# Patient Record
Sex: Female | Born: 1980 | Race: White | Hispanic: No | Smoking: Never smoker
Health system: Southern US, Community
[De-identification: ages and names within clinical notes are randomized; demographics above are authoritative.]

## PROBLEM LIST (undated history)

## (undated) DIAGNOSIS — F5105 Insomnia due to other mental disorder: Secondary | ICD-10-CM

## (undated) DIAGNOSIS — R42 Dizziness and giddiness: Secondary | ICD-10-CM

## (undated) DIAGNOSIS — Z789 Other specified health status: Secondary | ICD-10-CM

## (undated) DIAGNOSIS — F419 Anxiety disorder, unspecified: Secondary | ICD-10-CM

## (undated) DIAGNOSIS — R87612 Low grade squamous intraepithelial lesion on cytologic smear of cervix (LGSIL): Secondary | ICD-10-CM

## (undated) DIAGNOSIS — R5383 Other fatigue: Secondary | ICD-10-CM

## (undated) HISTORY — DX: Other specified health status: Z78.9

## (undated) HISTORY — PX: EYE SURGERY: SHX253

## (undated) HISTORY — PX: CHOLECYSTECTOMY: SHX55

---

## 2015-10-17 DIAGNOSIS — R87612 Low grade squamous intraepithelial lesion on cytologic smear of cervix (LGSIL): Secondary | ICD-10-CM | POA: Insufficient documentation

## 2016-07-12 DIAGNOSIS — B977 Papillomavirus as the cause of diseases classified elsewhere: Secondary | ICD-10-CM

## 2016-07-12 HISTORY — DX: Papillomavirus as the cause of diseases classified elsewhere: B97.7

## 2018-02-03 ENCOUNTER — Encounter: Payer: Self-pay | Admitting: Certified Nurse Midwife

## 2018-02-03 ENCOUNTER — Ambulatory Visit (INDEPENDENT_AMBULATORY_CARE_PROVIDER_SITE_OTHER): Payer: BLUE CROSS/BLUE SHIELD | Admitting: Certified Nurse Midwife

## 2018-02-03 VITALS — BP 117/83 | HR 94 | Ht 66.0 in | Wt 181.1 lb

## 2018-02-03 DIAGNOSIS — R4586 Emotional lability: Secondary | ICD-10-CM | POA: Diagnosis not present

## 2018-02-03 DIAGNOSIS — R61 Generalized hyperhidrosis: Secondary | ICD-10-CM

## 2018-02-03 DIAGNOSIS — G4700 Insomnia, unspecified: Secondary | ICD-10-CM

## 2018-02-03 DIAGNOSIS — R0683 Snoring: Secondary | ICD-10-CM

## 2018-02-03 DIAGNOSIS — F4321 Adjustment disorder with depressed mood: Secondary | ICD-10-CM

## 2018-02-03 DIAGNOSIS — R5383 Other fatigue: Secondary | ICD-10-CM

## 2018-02-03 DIAGNOSIS — Z3041 Encounter for surveillance of contraceptive pills: Secondary | ICD-10-CM

## 2018-02-03 DIAGNOSIS — R635 Abnormal weight gain: Secondary | ICD-10-CM

## 2018-02-03 NOTE — Patient Instructions (Signed)
Major Depressive Disorder, Adult Major depressive disorder (MDD) is a mental health condition. MDD often makes you feel sad, hopeless, or helpless. MDD can also cause symptoms in your body. MDD can affect your:  Work.  School.  Relationships.  Other normal activities.  MDD can range from mild to very bad. It may occur once (single episode MDD). It can also occur many times (recurrent MDD). The main symptoms of MDD often include:  Feeling sad, depressed, or irritable most of the time.  Loss of interest.  MDD symptoms also include:  Sleeping too much or too little.  Eating too much or too little.  A change in your weight.  Feeling tired (fatigue) or having low energy.  Feeling worthless.  Feeling guilty.  Trouble making decisions.  Trouble thinking clearly.  Thoughts of suicide or harming others.  Feeling weak.  Feeling agitated.  Keeping yourself from being around other people (isolation).  Follow these instructions at home: Activity  Do these things as told by your doctor: ? Go back to your normal activities. ? Exercise regularly. ? Spend time outdoors. Alcohol  Talk with your doctor about how alcohol can affect your antidepressant medicines.  Do not drink alcohol. Or, limit how much alcohol you drink. ? This means no more than 1 drink a day for nonpregnant women and 2 drinks a day for men. One drink equals one of these:  12 oz of beer.  5 oz of wine.  1 oz of hard liquor. General instructions  Take over-the-counter and prescription medicines only as told by your doctor.  Eat a healthy diet.  Get plenty of sleep.  Find activities that you enjoy. Make time to do them.  Think about joining a support group. Your doctor may be able to suggest a group for you.  Keep all follow-up visits as told by your doctor. This is important. Where to find more information:  [employer] on Mental Illness: ? www.nami.org  [place] [employer] of  Mental Health: ? http://www.maynard.net/  National Suicide Prevention Lifeline: ? 930-863-0562. This is free, 24-hour help. Contact a doctor if:  Your symptoms get worse.  You have new symptoms. Get help right away if:  You self-harm.  You see, hear, taste, smell, or feel things that are not present (hallucinate). If you ever feel like you may hurt yourself or others, or have thoughts about taking your own life, get help right away. You can go to your nearest emergency department or call:  Your local emergency services (911 in the [place]  A suicide crisis helpline, such as the National Suicide Prevention Lifeline: ? (901)852-0710. This is open 24 hours a day.  This information is not intended to replace advice given to you by your health care provider. Make sure you discuss any questions you have with your health care provider. Document Released: 06/09/2015 Document Revised: 03/14/2016 Document Reviewed: 03/14/2016 Elsevier Interactive Patient Education  2017 Elsevier Inc.  Perimenopause Perimenopause is the time when your body begins to move into the menopause (no menstrual period for 12 straight months). It is a natural process. Perimenopause can begin 2-8 years before the menopause and usually lasts for 1 year after the menopause. During this time, your ovaries may or may not produce an egg. The ovaries vary in their production of estrogen and progesterone hormones each month. This can cause irregular menstrual periods, difficulty getting pregnant, vaginal bleeding between periods, and uncomfortable symptoms. What are the causes?  Irregular production of the ovarian hormones, estrogen  and progesterone, and not ovulating every month. Other causes include:  Tumor of the pituitary gland in the brain.  Medical disease that affects the ovaries.  Radiation treatment.  Chemotherapy.  Unknown causes.  Heavy smoking and excessive alcohol intake can bring on perimenopause  sooner.  What are the signs or symptoms?  Hot flashes.  Night sweats.  Irregular menstrual periods.  Decreased sex drive.  Vaginal dryness.  Headaches.  Mood swings.  Depression.  Memory problems.  Irritability.  Tiredness.  Weight gain.  Trouble getting pregnant.  The beginning of losing bone cells (osteoporosis).  The beginning of hardening of the arteries (atherosclerosis). How is this diagnosed? Your health care provider will make a diagnosis by analyzing your age, menstrual history, and symptoms. He or she will do a physical exam and note any changes in your body, especially your female organs. Female hormone tests may or may not be helpful depending on the amount of female hormones you produce and when you produce them. However, other hormone tests may be helpful to rule out other problems. How is this treated? In some cases, no treatment is needed. The decision on whether treatment is necessary during the perimenopause should be made by you and your health care provider based on how the symptoms are affecting you and your lifestyle. Various treatments are available, such as:  Treating individual symptoms with a specific medicine for that symptom.  Herbal medicines that can help specific symptoms.  Counseling.  Group therapy.  Follow these instructions at home:  Keep track of your menstrual periods (when they occur, how heavy they are, how long between periods, and how long they last) as well as your symptoms and when they started.  Only take over-the-counter or prescription medicines as directed by your health care provider.  Sleep and rest.  Exercise.  Eat a diet that contains calcium (good for your bones) and soy (acts like the estrogen hormone).  Do not smoke.  Avoid alcoholic beverages.  Take vitamin supplements as recommended by your health care provider. Taking vitamin E may help in certain cases.  Take calcium and vitamin D supplements to  help prevent bone loss.  Group therapy is sometimes helpful.  Acupuncture may help in some cases. Contact a health care provider if:  You have questions about any symptoms you are having.  You need a referral to a specialist (gynecologist, psychiatrist, or psychologist). Get help right away if:  You have vaginal bleeding.  Your period lasts longer than 8 days.  Your periods are recurring sooner than 21 days.  You have bleeding after intercourse.  You have severe depression.  You have pain when you urinate.  You have severe headaches.  You have vision problems. This information is not intended to replace advice given to you by your health care provider. Make sure you discuss any questions you have with your health care provider. Document Released: 08/05/2004 Document Revised: 12/04/2015 Document Reviewed: 01/25/2013 Elsevier Interactive Patient Education  2017 Elsevier Inc. Sleep Apnea Sleep apnea is a condition that affects breathing. People with sleep apnea have moments during sleep when their breathing pauses briefly or gets shallow. Sleep apnea can cause these symptoms:  Trouble staying asleep.  Sleepiness or tiredness during the day.  Irritability.  Loud snoring.  Morning headaches.  Trouble concentrating.  Forgetting things.  Less interest in sex.  Being sleepy for no reason.  Mood swings.  Personality changes.  Depression.  Waking up a lot during the night to pee (urinate).  Dry mouth.  Sore throat.  Follow these instructions at home:  Make any changes in your routine that your doctor recommends.  Eat a healthy, well-balanced diet.  Take over-the-counter and prescription medicines only as told by your doctor.  Avoid using alcohol, calming medicines (sedatives), and narcotic medicines.  Take steps to lose weight if you are overweight.  If you were given a machine (device) to use while you sleep, use it only as told by your doctor.  Do  not use any tobacco products, such as cigarettes, chewing tobacco, and e-cigarettes. If you need help quitting, ask your doctor.  Keep all follow-up visits as told by your doctor. This is important. Contact a doctor if:  The machine that you were given to use during sleep is uncomfortable or does not seem to be working.  Your symptoms do not get better.  Your symptoms get worse. Get help right away if:  Your chest hurts.  You have trouble breathing in enough air (shortness of breath).  You have an uncomfortable feeling in your back, arms, or stomach.  You have trouble talking.  One side of your body feels weak.  A part of your face is hanging down (drooping). These symptoms may be an emergency. Do not wait to see if the symptoms will go away. Get medical help right away. Call your local emergency services (911 in the [place] Do not drive yourself to the hospital. This information is not intended to replace advice given to you by your health care provider. Make sure you discuss any questions you have with your health care provider. Document Released: 04/06/2008 Document Revised: 02/22/2016 Document Reviewed: 04/07/2015 Elsevier Interactive Patient Education  2018 [employer] Insomnia Insomnia is a sleep disorder that makes it difficult to fall asleep or to stay asleep. Insomnia can cause tiredness (fatigue), low energy, difficulty concentrating, mood swings, and poor performance at work or school. There are three different ways to classify insomnia:  Difficulty falling asleep.  Difficulty staying asleep.  Waking up too early in the morning.  Any type of insomnia can be long-term (chronic) or short-term (acute). Both are common. Short-term insomnia usually lasts for three months or less. Chronic insomnia occurs at least three times a week for longer than three months. What are the causes? Insomnia may be caused by another condition, situation, or substance, such  as:  Anxiety.  Certain medicines.  Gastroesophageal reflux disease (GERD) or other gastrointestinal conditions.  Asthma or other breathing conditions.  Restless legs syndrome, sleep apnea, or other sleep disorders.  Chronic pain.  Menopause. This may include hot flashes.  Stroke.  Abuse of alcohol, tobacco, or illegal drugs.  Depression.  Caffeine.  Neurological disorders, such as Alzheimer disease.  An overactive thyroid (hyperthyroidism).  The cause of insomnia may not be known. What increases the risk? Risk factors for insomnia include:  Gender. Women are more commonly affected than men.  Age. Insomnia is more common as you get older.  Stress. This may involve your professional or personal life.  Income. Insomnia is more common in people with lower income.  Lack of exercise.  Irregular work schedule or night shifts.  Traveling between different time zones.  What are the signs or symptoms? If you have insomnia, trouble falling asleep or trouble staying asleep is the main symptom. This may lead to other symptoms, such as:  Feeling fatigued.  Feeling nervous about going to sleep.  Not feeling rested in the morning.  Having trouble concentrating.  Feeling  irritable, anxious, or depressed.  How is this treated? Treatment for insomnia depends on the cause. If your insomnia is caused by an underlying condition, treatment will focus on addressing the condition. Treatment may also include:  Medicines to help you sleep.  Counseling or therapy.  Lifestyle adjustments.  Follow these instructions at home:  Take medicines only as directed by your health care provider.  Keep regular sleeping and waking hours. Avoid naps.  Keep a sleep diary to help you and your health care provider figure out what could be causing your insomnia. Include: ? When you sleep. ? When you wake up during the night. ? How well you sleep. ? How rested you feel the next  day. ? Any side effects of medicines you are taking. ? What you eat and drink.  Make your bedroom a comfortable place where it is easy to fall asleep: ? Put up shades or special blackout curtains to block light from outside. ? Use a white noise machine to block noise. ? Keep the temperature cool.  Exercise regularly as directed by your health care provider. Avoid exercising right before bedtime.  Use relaxation techniques to manage stress. Ask your health care provider to suggest some techniques that may work well for you. These may include: ? Breathing exercises. ? Routines to release muscle tension. ? Visualizing peaceful scenes.  Cut back on alcohol, caffeinated beverages, and cigarettes, especially close to bedtime. These can disrupt your sleep.  Do not overeat or eat spicy foods right before bedtime. This can lead to digestive discomfort that can make it hard for you to sleep.  Limit screen use before bedtime. This includes: ? Watching TV. ? Using your smartphone, tablet, and computer.  Stick to a routine. This can help you fall asleep faster. Try to do a quiet activity, brush your teeth, and go to bed at the same time each night.  Get out of bed if you are still awake after 15 minutes of trying to sleep. Keep the lights down, but try reading or doing a quiet activity. When you feel sleepy, go back to bed.  Make sure that you drive carefully. Avoid driving if you feel very sleepy.  Keep all follow-up appointments as directed by your health care provider. This is important. Contact a health care provider if:  You are tired throughout the day or have trouble in your daily routine due to sleepiness.  You continue to have sleep problems or your sleep problems get worse. Get help right away if:  You have serious thoughts about hurting yourself or someone else. This information is not intended to replace advice given to you by your health care provider. Make sure you discuss any  questions you have with your health care provider. Document Released: 06/25/2000 Document Revised: 11/28/2015 Document Reviewed: 03/29/2014 Elsevier Interactive Patient Education  [employer].

## 2018-02-03 NOTE — Progress Notes (Signed)
New pt is here with c/o increased night sweats. Pt states she has no motivation. Screening 13.

## 2018-02-10 ENCOUNTER — Telehealth: Payer: Self-pay | Admitting: Certified Nurse Midwife

## 2018-02-10 DIAGNOSIS — Z3041 Encounter for surveillance of contraceptive pills: Secondary | ICD-10-CM | POA: Insufficient documentation

## 2018-02-10 LAB — HEMOGLOBIN A1C
Est. average glucose Bld gHb Est-mCnc: 105 mg/dL
Hgb A1c MFr Bld: 5.3 % (ref 4.8–5.6)

## 2018-02-10 LAB — THYROID PANEL WITH TSH
Free Thyroxine Index: 2.3 (ref 1.2–4.9)
T3 Uptake Ratio: 21 % — ABNORMAL LOW (ref 24–39)
T4, Total: 10.9 ug/dL (ref 4.5–12.0)
TSH: 0.715 u[IU]/mL (ref 0.450–4.500)

## 2018-02-10 LAB — PROGESTERONE: Progesterone: 0.2 ng/mL

## 2018-02-10 LAB — FSH/LH
FSH: 3.5 m[IU]/mL
LH: 2.7 m[IU]/mL

## 2018-02-10 LAB — ESTRADIOL: Estradiol: 12.5 pg/mL

## 2018-02-10 LAB — VITAMIN D 1,25 DIHYDROXY
VITAMIN D3 1, 25 (OH): 49 pg/mL
Vitamin D 1, 25 (OH)2 Total: 52 pg/mL
Vitamin D2 1, 25 (OH)2: <10 pg/mL

## 2018-02-10 LAB — LIPID PANEL
CHOLESTEROL TOTAL: 160 mg/dL (ref 100–199)
Chol/HDL Ratio: 3 ratio (ref 0.0–4.4)
HDL: 54 mg/dL (ref >39)
LDL Calculated: 76 mg/dL (ref 0–99)
Triglycerides: 148 mg/dL (ref 0–149)
VLDL Cholesterol Cal: 30 mg/dL (ref 5–40)

## 2018-02-10 NOTE — Telephone Encounter (Signed)
Patient called requesting lab results. Thanks.

## 2018-02-10 NOTE — Progress Notes (Signed)
GYN ENCOUNTER NOTE  Subjective:       Heather Mcgee is a 37 y.o. G46P1001 female is here for gynecologic evaluation of the following issues:   1. Insomnia-not getting much sleeps, wakes up sporadically during the night time  2. Mood changes-feeling off  3. Fatigue  4. Weight gain and increased hunger  5. Night sweats-every night  6. Snoring-father has sleep apnea  7. Grief  Reports increased symptoms for the last few months. Dealing with recent death of grandmother and text message breakup on her birthday from her long distance boyfriend.   Denies difficulty breathing or respiratory distress, chest pain, abdominal pain, excessive vaginal bleeding, dysuria, and leg pain or swelling.    Gynecologic History  Patient's last menstrual period was 12/05/2017 (approximate). Period Pattern: Regular Menstrual Flow: Light  Contraception: OCP (estrogen/progesterone)  Last Pap: 04/2017. Results were: normal.   Obstetric History  OB History  Gravida Para Term Preterm AB Living  1 1 1     1   SAB TAB Ectopic Multiple Live Births          1    # Outcome Date GA Lbr Len/2nd Weight Sex Delivery Anes PTL Lv  1 Term 2011    M CS-Unspec  N LIV    Current Outpatient Medications on File Prior to Visit  Medication Sig Dispense Refill  . hydrOXYzine (VISTARIL) 50 MG capsule Take by mouth.    . Multiple Vitamin (MULTIVITAMIN) capsule Take by mouth.    . Omega-3 Fatty Acids (FISH OIL) 1000 MG CAPS Take by mouth.    . SRONYX 0.1-20 MG-MCG tablet Take 1 tablet by mouth daily.  3   No current facility-administered medications on file prior to visit.     Allergies no known allergies  Social History   Socioeconomic History  . Marital status: Single    Spouse name: Not on file  . Number of children: Not on file  . Years of education: Not on file  . Highest education level: Not on file  Occupational History  . Not on file  Social Needs  . Financial resource strain: Not on  file  . Food insecurity:    Worry: Not on file    Inability: Not on file  . Transportation needs:    Medical: Not on file    Non-medical: Not on file  Tobacco Use  . Smoking status: Never Smoker  . Smokeless tobacco: Never Used  Substance and Sexual Activity  . Alcohol use: Never    Frequency: Never  . Drug use: Never  . Sexual activity: Not Currently    Birth control/protection: Pill  Lifestyle  . Physical activity:    Days per week: Not on file    Minutes per session: Not on file  . Stress: Not on file  Relationships  . Social connections:    Talks on phone: Not on file    Gets together: Not on file    Attends religious service: Not on file    Active member of club or organization: Not on file    Attends meetings of clubs or organizations: Not on file    Relationship status: Not on file  . Intimate partner violence:    Fear of current or ex partner: Not on file    Emotionally abused: Not on file    Physically abused: Not on file    Forced sexual activity: Not on file  Other Topics Concern  . Not on file  Social History Narrative  .  Not on file    No family history on file.  The following portions of the patient's history were reviewed and updated as appropriate: allergies, current medications, past family history, past medical history, past social history, past surgical history and problem list.  Review of Systems  ROS negative except as noted above. Information obtained from patient.   Objective:   BP 117/83   Pulse 94   Ht 5\' 6"  (1.676 m)   Wt 181 lb 1 oz (82.1 kg)   LMP 12/05/2017 (Approximate)   BMI 29.22 kg/m   CONSTITUTIONAL: Well-developed, well-nourished female in no acute distress.   NECK: Normal range of motion, supple, no masses.  Normal thyroid. Circumference: 12.5 inches.  NEUROLGIC: Alert and oriented to person, place, and time.  PSYCHIATRIC: Normal mood and affect. Normal behavior. Normal judgment and thought content.    Office Visit  [facility] 02/03/2018 [facility]  PHQ-9 Total Score  13      Assessment:   1. Insomnia, unspecified type  - FSH/LH - Thyroid Panel With TSH - Estradiol - Vitamin D 1,25 dihydroxy - Progesterone - Lipid panel  2. Mood changes  - FSH/LH - Thyroid Panel With TSH - Estradiol - Vitamin D 1,25 dihydroxy - Progesterone - Lipid panel  3. Other fatigue  - FSH/LH - Thyroid Panel With TSH - Estradiol - Vitamin D 1,25 dihydroxy - Progesterone - Lipid panel - Hemoglobin A1c  4. Weight gain  - FSH/LH - Thyroid Panel With TSH - Estradiol - Vitamin D 1,25 dihydroxy - Progesterone - Lipid panel - Hemoglobin A1c  5. Night sweats  - FSH/LH - Thyroid Panel With TSH - Estradiol - Vitamin D 1,25 dihydroxy - Progesterone - Lipid panel  6. Snoring  - FSH/LH - Thyroid Panel With TSH - Estradiol - Vitamin D 1,25 dihydroxy - Progesterone - Lipid panel  7. Grief - FSH/LH - Thyroid Panel With TSH - Estradiol - Vitamin D 1,25 dihydroxy - Progesterone - Lipid panel     Plan:   Labs: see orders.   Discussed depression screening results.   Patient would like to await lab results and then discussed options for management and/or treatment.   Reviewed red flag symptoms and when to call.   RTC as needed.    Gunnar BullaJenkins Michelle Keren Alverio, CNM [facility], [facility]

## 2018-02-13 ENCOUNTER — Telehealth: Payer: Self-pay

## 2018-02-13 NOTE — Telephone Encounter (Signed)
Message left for pt- lab results are in but have not been reviewed yet. When reviewed pt will be contacted.

## 2018-02-14 NOTE — Telephone Encounter (Signed)
The patient called back and asked again about her results.  The patient was informed that Marcelino DusterMichelle will not be back in the office until 8/8 and she has to review the results before they can be shared with her.  She was also informed that a telephone message would be sent again to the nurse and provider because she is hoping those will be released soon as she is going out of town and "it has already been a week," please advise, thanks.

## 2018-02-15 NOTE — Telephone Encounter (Signed)
Please contact patient. Labs reviewed. Detailed MyChart message sent. Should schedule office visit to discuss results or treatment options further, if desired. Thanks, JML

## 2018-02-17 ENCOUNTER — Ambulatory Visit (INDEPENDENT_AMBULATORY_CARE_PROVIDER_SITE_OTHER): Payer: BLUE CROSS/BLUE SHIELD | Admitting: Certified Nurse Midwife

## 2018-02-17 ENCOUNTER — Encounter: Payer: Self-pay | Admitting: Certified Nurse Midwife

## 2018-02-17 VITALS — BP 114/82 | HR 83 | Ht 67.0 in | Wt 184.4 lb

## 2018-02-17 DIAGNOSIS — Z09 Encounter for follow-up examination after completed treatment for conditions other than malignant neoplasm: Secondary | ICD-10-CM | POA: Diagnosis not present

## 2018-02-17 MED ORDER — PAROXETINE HCL 10 MG PO TABS: 10.0000 mg | ORAL_TABLET | Freq: Every day | ORAL | 0 refills | Status: DC

## 2018-02-17 NOTE — Patient Instructions (Signed)
Paroxetine tablets What is this medicine? PAROXETINE (pa ROX e teen) is used to treat depression. It may also be used to treat anxiety disorders, obsessive compulsive disorder, panic attacks, post traumatic stress, and premenstrual dysphoric disorder (PMDD). This medicine may be used for other purposes; ask your health care provider or pharmacist if you have questions. COMMON BRAND NAME(S): Paxil, Pexeva What should I tell my health care provider before I take this medicine? They need to know if you have any of these conditions: -bipolar disorder or a family history of bipolar disorder -bleeding disorders -glaucoma -heart disease -kidney disease -liver disease -low levels of sodium in the blood -seizures -suicidal thoughts, plans, or attempt; a previous suicide attempt by you or a family member -take MAOIs like Carbex, Eldepryl, Marplan, Nardil, and Parnate -take medicines that treat or prevent blood clots -thyroid disease -an unusual or allergic reaction to paroxetine, other medicines, foods, dyes, or preservatives -pregnant or trying to get pregnant -breast-feeding How should I use this medicine? Take this medicine by mouth with a glass of water. Follow the directions on the prescription label. You can take it with or without food. Take your medicine at regular intervals. Do not take your medicine more often than directed. Do not stop taking this medicine suddenly except upon the advice of your doctor. Stopping this medicine too quickly may cause serious side effects or your condition may worsen. A special MedGuide will be given to you by the pharmacist with each prescription and refill. Be sure to read this information carefully each time. Talk to your pediatrician regarding the use of this medicine in children. Special care may be needed. Overdosage: If you think you have taken too much of this medicine contact a poison control center or emergency room at once. NOTE: This medicine is  only for you. Do not share this medicine with others. What if I miss a dose? If you miss a dose, take it as soon as you can. If it is almost time for your next dose, take only that dose. Do not take double or extra doses. What may interact with this medicine? Do not take this medicine with any of the following medications: -linezolid -MAOIs like Carbex, Eldepryl, Marplan, Nardil, and Parnate -methylene blue (injected into a vein) -pimozide -thioridazine This medicine may also interact with the following medications: -alcohol -amphetamines -aspirin and aspirin-like medicines -atomoxetine -certain medicines for depression, anxiety, or psychotic disturbances -certain medicines for irregular heart beat like propafenone, flecainide, encainide, and quinidine -certain medicines for migraine headache like almotriptan, eletriptan, frovatriptan, naratriptan, rizatriptan, sumatriptan, zolmitriptan -cimetidine -digoxin -diuretics -fentanyl -fosamprenavir -furazolidone -isoniazid -lithium -medicines that treat or prevent blood clots like warfarin, enoxaparin, and dalteparin -medicines for sleep -NSAIDs, medicines for pain and inflammation, like ibuprofen or naproxen -phenobarbital -phenytoin -procarbazine -rasagiline -ritonavir -supplements like St. John's wort, kava kava, valerian -tamoxifen -tramadol -tryptophan This list may not describe all possible interactions. Give your health care provider a list of all the medicines, herbs, non-prescription drugs, or dietary supplements you use. Also tell them if you smoke, drink alcohol, or use illegal drugs. Some items may interact with your medicine. What should I watch for while using this medicine? Tell your doctor if your symptoms do not get better or if they get worse. Visit your doctor or health care professional for regular checks on your progress. Because it may take several weeks to see the full effects of this medicine, it is important  to continue your treatment as prescribed by your doctor.  Patients and their families should watch out for new or worsening thoughts of suicide or depression. Also watch out for sudden changes in feelings such as feeling anxious, agitated, panicky, irritable, hostile, aggressive, impulsive, severely restless, overly excited and hyperactive, or not being able to sleep. If this happens, especially at the beginning of treatment or after a change in dose, call your health care professional. Bonita Quin may get drowsy or dizzy. Do not drive, use machinery, or do anything that needs mental alertness until you know how this medicine affects you. Do not stand or sit up quickly, especially if you are an older patient. This reduces the risk of dizzy or fainting spells. Alcohol may interfere with the effect of this medicine. Avoid alcoholic drinks. Your mouth may get dry. Chewing sugarless gum or sucking hard candy, and drinking plenty of water will help. Contact your doctor if the problem does not go away or is severe. What side effects may I notice from receiving this medicine? Side effects that you should report to your doctor or health care professional as soon as possible: -allergic reactions like skin rash, itching or hives, swelling of the face, lips, or tongue -anxious -black, tarry stools -changes in vision -confusion -elevated mood, decreased need for sleep, racing thoughts, impulsive behavior -eye pain -fast, irregular heartbeat -feeling faint or lightheaded, falls -feeling agitated, angry, or irritable -hallucination, loss of contact with reality -loss of balance or coordination -loss of memory -painful or prolonged erections -restlessness, pacing, inability to keep still -seizures -stiff muscles -suicidal thoughts or other mood changes -trouble sleeping -unusual bleeding or bruising -unusually weak or tired -vomiting Side effects that usually do not require medical attention (report to your  doctor or health care professional if they continue or are bothersome): -change in appetite or weight -change in sex drive or performance -diarrhea -dizziness -dry mouth -increased sweating -indigestion, nausea -tired -tremors This list may not describe all possible side effects. Call your doctor for medical advice about side effects. You may report side effects to FDA at 1-800-FDA-1088. Where should I keep my medicine? Keep out of the reach of children. Store at room temperature between 15 and 30 degrees C (59 and 86 degrees F). Keep container tightly closed. Throw away any unused medicine after the expiration date. NOTE: This sheet is a summary. It may not cover all possible information. If you have questions about this medicine, talk to your doctor, pharmacist, or health care provider.  2018 Elsevier/Gold Standard (2015-11-29 15:50:32) Phentermine tablets or capsules What is this medicine? PHENTERMINE (FEN ter meen) decreases your appetite. It is used with a reduced calorie diet and exercise to help you lose weight. This medicine may be used for other purposes; ask your health care provider or pharmacist if you have questions. COMMON BRAND NAME(S): Adipex-P, Atti-Plex P, Atti-Plex P Spansule, Fastin, Lomaira, Pro-Fast, Tara-8 What should I tell my health care provider before I take this medicine? They need to know if you have any of these conditions: -agitation -glaucoma -heart disease -high blood pressure -history of substance abuse -lung disease called Primary Pulmonary Hypertension (PPH) -taken an MAOI like Carbex, Eldepryl, Marplan, Nardil, or Parnate in last 14 days -thyroid disease -an unusual or allergic reaction to phentermine, other medicines, foods, dyes, or preservatives -pregnant or trying to get pregnant -breast-feeding How should I use this medicine? Take this medicine by mouth with a glass of water. Follow the directions on the prescription label. The  instructions for use may differ based on the product  and dose you are taking. Avoid taking this medicine in the evening. It may interfere with sleep. Take your doses at regular intervals. Do not take your medicine more often than directed. Talk to your pediatrician regarding the use of this medicine in children. While this drug may be prescribed for children 17 years or older for selected conditions, precautions do apply. Overdosage: If you think you have taken too much of this medicine contact a poison control center or emergency room at once. NOTE: This medicine is only for you. Do not share this medicine with others. What if I miss a dose? If you miss a dose, take it as soon as you can. If it is almost time for your next dose, take only that dose. Do not take double or extra doses. What may interact with this medicine? Do not take this medicine with any of the following medications: -duloxetine -MAOIs like Carbex, Eldepryl, Marplan, Nardil, and Parnate -medicines for colds or breathing difficulties like pseudoephedrine or phenylephrine -procarbazine -sibutramine -SSRIs like citalopram, escitalopram, fluoxetine, fluvoxamine, paroxetine, and sertraline -stimulants like dexmethylphenidate, methylphenidate or modafinil -venlafaxine This medicine may also interact with the following medications: -medicines for diabetes This list may not describe all possible interactions. Give your health care provider a list of all the medicines, herbs, non-prescription drugs, or dietary supplements you use. Also tell them if you smoke, drink alcohol, or use illegal drugs. Some items may interact with your medicine. What should I watch for while using this medicine? Notify your physician immediately if you become short of breath while doing your normal activities. Do not take this medicine within 6 hours of bedtime. It can keep you from getting to sleep. Avoid drinks that contain caffeine and try to stick to a  regular bedtime every night. This medicine was intended to be used in addition to a healthy diet and exercise. The best results are achieved this way. This medicine is only indicated for short-term use. Eventually your weight loss may level out. At that point, the drug will only help you maintain your new weight. Do not increase or in any way change your dose without consulting your doctor. You may get drowsy or dizzy. Do not drive, use machinery, or do anything that needs mental alertness until you know how this medicine affects you. Do not stand or sit up quickly, especially if you are an older patient. This reduces the risk of dizzy or fainting spells. Alcohol may increase dizziness and drowsiness. Avoid alcoholic drinks. What side effects may I notice from receiving this medicine? Side effects that you should report to your doctor or health care professional as soon as possible: -chest pain, palpitations -depression or severe changes in mood -increased blood pressure -irritability -nervousness or restlessness -severe dizziness -shortness of breath -problems urinating -unusual swelling of the legs -vomiting Side effects that usually do not require medical attention (report to your doctor or health care professional if they continue or are bothersome): -blurred vision or other eye problems -changes in sexual ability or desire -constipation or diarrhea -difficulty sleeping -dry mouth or unpleasant taste -headache -nausea This list may not describe all possible side effects. Call your doctor for medical advice about side effects. You may report side effects to FDA at 1-800-FDA-1088. Where should I keep my medicine? Keep out of the reach of children. This medicine can be abused. Keep your medicine in a safe place to protect it from theft. Do not share this medicine with anyone. Selling or giving away this  medicine is dangerous and against the law. This medicine may cause accidental overdose  and death if taken by other adults, children, or pets. Mix any unused medicine with a substance like cat litter or coffee grounds. Then throw the medicine away in a sealed container like a sealed bag or a coffee can with a lid. Do not use the medicine after the expiration date. Store at room temperature between 20 and 25 degrees C (68 and 77 degrees F). Keep container tightly closed. NOTE: This sheet is a summary. It may not cover all possible information. If you have questions about this medicine, talk to your doctor, pharmacist, or health care provider.  2018 Elsevier/Gold Standard (2015-04-04 12:53:15)

## 2018-02-19 NOTE — Progress Notes (Signed)
GYN ENCOUNTER NOTE  Subjective:       Heather Mcgee is a 37 y.o. [id number] female here for follow up appointment to discuss labs results.   Seen on 02/03/2018 for evaluation of insomnia, mood changes, fatigue, weight gain and increased hunger, night sweats, snoring, and grief.    Desires medication for weight loss. Appointment schedule with Harlow MaresMelody Shambley, CNM.   Denies difficulty breathing or respiratory distress, chest pain, abdominal pain, excessive vaginal bleeding, dysuria, and leg pain or swelling.   Gynecologic History  No LMP recorded. (Menstrual status: Oral contraceptives).  Contraception: OCP (estrogen/progesterone)  Last Pap: 04/2017. Results were: normal  Obstetric History  OB History  Gravida Para Term Preterm AB Living  1 1 1     1   SAB TAB Ectopic Multiple Live Births          1    # Outcome Date GA Lbr Len/2nd Weight Sex Delivery Anes PTL Lv  1 Term 2011    M CS-Unspec  N LIV    History reviewed. No pertinent past medical history.  Past Surgical History:  Procedure Laterality Date  . CESAREAN SECTION    . CHOLECYSTECTOMY    . EYE SURGERY      Current Outpatient Medications on File Prior to Visit  Medication Sig Dispense Refill  . Multiple Vitamin (MULTIVITAMIN) capsule Take by mouth.    . Omega-3 Fatty Acids (FISH OIL) 1000 MG CAPS Take by mouth.    . SRONYX 0.1-20 MG-MCG tablet Take 1 tablet by mouth daily.  3  . hydrOXYzine (VISTARIL) 50 MG capsule Take by mouth.     No current facility-administered medications on file prior to visit.     No Known Allergies  Social History   Socioeconomic History  . Marital status: Single    Spouse name: Not on file  . Number of children: Not on file  . Years of education: Not on file  . Highest education level: Not on file  Occupational History  . Not on file  Social Needs  . Financial resource strain: Not on file  . Food insecurity:    Worry: Not on file    Inability: Not on file  .  Transportation needs:    Medical: Not on file    Non-medical: Not on file  Tobacco Use  . Smoking status: Never Smoker  . Smokeless tobacco: Never Used  Substance and Sexual Activity  . Alcohol use: Never    Frequency: Never  . Drug use: Never  . Sexual activity: Not Currently    Birth control/protection: Pill  Lifestyle  . Physical activity:    Days per week: Not on file    Minutes per session: Not on file  . Stress: Not on file  Relationships  . Social connections:    Talks on phone: Not on file    Gets together: Not on file    Attends religious service: Not on file    Active member of club or organization: Not on file    Attends meetings of clubs or organizations: Not on file    Relationship status: Not on file  . Intimate partner violence:    Fear of current or ex partner: Not on file    Emotionally abused: Not on file    Physically abused: Not on file    Forced sexual activity: Not on file  Other Topics Concern  . Not on file  Social History Narrative  . Not on file  History reviewed. No pertinent family history.  The following portions of the patient's history were reviewed and updated as appropriate: allergies, current medications, past family history, past medical history, past social history, past surgical history and problem list.  Review of Systems  ROS negative except as noted above. Information obtained from patient.   Objective:   BP 114/82   Pulse 83   Ht 5\' 7"  (1.702 m)   Wt 184 lb 6.4 oz (83.6 kg)   BMI 28.88 kg/m    CONSTITUTIONAL: Well-developed, well-nourished female in no acute distress.     Recent Results (from the past 2160 hour(s))  FSH/LH     Status: None   Collection Time: 02/03/18  2:59 PM  Result Value Ref Range   LH 2.7 mIU/mL    Comment:                     Adult Female:                       Follicular phase      2.4 -  12.6                       Ovulation phase      14.0 -  95.6                       Luteal phase           1.0 -  11.4                       Postmenopausal        7.7 -  58.5    FSH 3.5 mIU/mL    Comment:                     Adult Female:                       Follicular phase      3.5 -  12.5                       Ovulation phase       4.7 -  21.5                       Luteal phase          1.7 -   7.7                       Postmenopausal       25.8 - 134.8   Thyroid Panel With TSH     Status: Abnormal   Collection Time: 02/03/18  2:59 PM  Result Value Ref Range   TSH 0.715 0.450 - 4.500 uIU/mL   T4, Total 10.9 4.5 - 12.0 ug/dL   T3 Uptake Ratio 21 (L) 24 - 39 %   Free Thyroxine Index 2.3 1.2 - 4.9  Estradiol     Status: None   Collection Time: 02/03/18  2:59 PM  Result Value Ref Range   Estradiol 12.5 pg/mL    Comment:                     Adult Female:  Follicular phase   12.5 -   166.0                       Ovulation phase    85.8 -   498.0                       Luteal phase       43.8 -   211.0                       Postmenopausal     <6.0 -    54.7                     Pregnancy                       1st trimester     215.0 - >4300.0                     Girls (1-10 years)    6.0 -    27.0 Roche ECLIA methodology   Vitamin D 1,25 dihydroxy     Status: None   Collection Time: 02/03/18  2:59 PM  Result Value Ref Range   Vitamin D 1, 25 (OH)2 Total 52 pg/mL    Comment: Reference Range: Adults: 21 - 65    Vitamin D2 1, 25 (OH)2 <10 pg/mL   Vitamin D3 1, 25 (OH)2 49 pg/mL  Progesterone     Status: None   Collection Time: 02/03/18  2:59 PM  Result Value Ref Range   Progesterone 0.2 ng/mL    Comment:                      Follicular phase       0.1 -   0.9                      Luteal phase           1.8 -  23.9                      Ovulation phase        0.1 -  12.0                      Pregnant                         First trimester    11.0 -  44.3                         Second trimester   25.4 -  83.3                         Third trimester    58.7 -  214.0                      Postmenopausal         0.0 -   0.1   Lipid panel     Status: None   Collection Time: 02/03/18  2:59 PM  Result Value Ref Range   Cholesterol, Total 160 100 - 199 mg/dL   Triglycerides 161 0 - 149 mg/dL   HDL 54 >09 mg/dL   VLDL Cholesterol  Cal 30 5 - 40 mg/dL   LDL Calculated 76 0 - 99 mg/dL   Chol/HDL Ratio 3.0 0.0 - 4.4 ratio    Comment:                                   T. Chol/HDL Ratio                                             Men  Women                               1/2 Avg.Risk  3.4    3.3                                   Avg.Risk  5.0    4.4                                2X Avg.Risk  9.6    7.1                                3X Avg.Risk 23.4   11.0   Hemoglobin A1c     Status: None   Collection Time: 02/03/18  2:59 PM  Result Value Ref Range   Hgb A1c MFr Bld 5.3 4.8 - 5.6 %    Comment:          Prediabetes: 5.7 - 6.4          Diabetes: >6.4          Glycemic control for adults with diabetes: <7.0    Est. average glucose Bld gHb Est-mCnc 105 mg/dL   Assessment:   1. Follow up   Plan:   Lab results and treatment options orginally sent via MyChart.  See MyChart message for explanation of treatment options. Patient presents for face to face discussion.  Education regarding medical weight loss options and criteria for care. Patient is not a candidate for medical weight loss at this time due to BMI <30 and lack of comorbidity.   Rx: Paxil, see orders  Sample of NuvaRing given per patient request.   Reviewed red flag symptoms and when to call.   RTC x 4-6 weeks for evaluation or sooner if needed.    Gunnar Bulla, CNM [facility], [facility]

## 2018-02-24 ENCOUNTER — Encounter: Payer: BLUE CROSS/BLUE SHIELD | Admitting: Obstetrics and Gynecology

## 2018-03-11 ENCOUNTER — Other Ambulatory Visit: Payer: Self-pay | Admitting: Certified Nurse Midwife

## 2018-03-20 ENCOUNTER — Other Ambulatory Visit: Payer: Self-pay

## 2018-03-20 ENCOUNTER — Encounter: Payer: Self-pay | Admitting: Certified Nurse Midwife

## 2018-03-20 MED ORDER — PAROXETINE HCL 10 MG PO TABS: 10.0000 mg | ORAL_TABLET | Freq: Every day | ORAL | 1 refills | Status: DC

## 2018-03-21 ENCOUNTER — Other Ambulatory Visit: Payer: Self-pay

## 2018-03-21 MED ORDER — PAROXETINE HCL 10 MG PO TABS: 10.0000 mg | ORAL_TABLET | Freq: Every day | ORAL | 1 refills | Status: DC

## 2018-03-31 ENCOUNTER — Encounter: Payer: Self-pay | Admitting: Certified Nurse Midwife

## 2018-04-06 ENCOUNTER — Other Ambulatory Visit: Payer: Self-pay | Admitting: Certified Nurse Midwife

## 2018-04-06 MED ORDER — NORELGESTROMIN-ETH ESTRADIOL 150-35 MCG/24HR TD PTWK: 1.0000 | MEDICATED_PATCH | TRANSDERMAL | 12 refills | Status: DC

## 2018-04-07 ENCOUNTER — Encounter: Payer: Self-pay | Admitting: Certified Nurse Midwife

## 2018-04-10 ENCOUNTER — Encounter: Payer: Self-pay | Admitting: Certified Nurse Midwife

## 2018-04-17 ENCOUNTER — Other Ambulatory Visit: Payer: Self-pay

## 2018-04-17 MED ORDER — SRONYX 0.1-20 MG-MCG PO TABS: 1.0000 | ORAL_TABLET | Freq: Every day | ORAL | 3 refills | Status: DC

## 2018-04-17 NOTE — Telephone Encounter (Signed)
Please refill previous birth control pill. Thanks, JML

## 2018-04-24 ENCOUNTER — Other Ambulatory Visit: Payer: Self-pay

## 2018-04-24 MED ORDER — SRONYX 0.1-20 MG-MCG PO TABS: 1.0000 | ORAL_TABLET | Freq: Every day | ORAL | 3 refills | Status: DC

## 2018-04-24 NOTE — Telephone Encounter (Signed)
About OCP next August. If requests are regarding mood medication, then will need appointment. Thanks, JML

## 2018-05-22 ENCOUNTER — Encounter: Payer: Self-pay | Admitting: Certified Nurse Midwife

## 2018-06-23 ENCOUNTER — Encounter: Payer: Self-pay | Admitting: Certified Nurse Midwife

## 2018-06-27 ENCOUNTER — Other Ambulatory Visit: Payer: Self-pay | Admitting: Certified Nurse Midwife

## 2018-06-29 ENCOUNTER — Ambulatory Visit (INDEPENDENT_AMBULATORY_CARE_PROVIDER_SITE_OTHER): Payer: BLUE CROSS/BLUE SHIELD | Admitting: Certified Nurse Midwife

## 2018-06-29 ENCOUNTER — Encounter: Payer: Self-pay | Admitting: Certified Nurse Midwife

## 2018-06-29 VITALS — BP 125/90 | HR 91 | Ht 67.0 in | Wt 195.1 lb

## 2018-06-29 DIAGNOSIS — Z3202 Encounter for pregnancy test, result negative: Secondary | ICD-10-CM | POA: Diagnosis not present

## 2018-06-29 DIAGNOSIS — Z30430 Encounter for insertion of intrauterine contraceptive device: Secondary | ICD-10-CM | POA: Diagnosis not present

## 2018-06-29 LAB — POCT URINE PREGNANCY: PREG TEST UR: NEGATIVE

## 2018-06-29 MED ORDER — PHENTERMINE HCL 37.5 MG PO CAPS: 37.5000 mg | ORAL_CAPSULE | ORAL | 2 refills | Status: DC

## 2018-06-29 MED ORDER — CYANOCOBALAMIN 1000 MCG/ML IJ SOLN: 1000.0000 ug | Freq: Once | INTRAMUSCULAR | 2 refills | Status: AC

## 2018-06-29 NOTE — Progress Notes (Signed)
Heather Mcgee is a 37 y.o. year old [id number] Caucasian female who presents for placement of a Mirena IUD.  BP 125/90   Pulse 91   Ht 5\' 7"  (1.702 m)   Wt 195 lb 2 oz (88.5 kg)   LMP 06/27/2018 (Exact Date)   BMI 30.56 kg/m    Pregnancy test today was negative.   The risks and benefits of the method and placement have been thouroughly reviewed with the patient and all questions were answered.  Specifically the patient is aware of failure rate of 07/998, expulsion of the IUD and of possible perforation.  The patient is aware of irregular bleeding due to the method and understands the incidence of irregular bleeding diminishes with time.  Signed copy of informed consent in chart.   Time out was performed.  A small plastic speculum was placed in the vagina.  The cervix was visualized, prepped using Betadine, and grasped with a single tooth tenaculum. The uterus was sounded to 8 cm.  Mirena IUD placed per manufacturer's recommendations.   The strings were trimmed to 3 cm.  The patient was given post procedure instructions, including signs and symptoms of infection and to check for the strings after each menses or each month, and refraining from intercourse or anything in the vagina for 3 days.  She was given a Mirena care card with date Mirena placed, and date Mirena to be removed.  Rx: Phentermine and B12, see orders.   Reviewed red flag symptoms and when to call.   RTC x 4 weeks for string check and weight management visit.    Heather Mcgee, CNM [facility], [facility]   NDC [id number] Lot: [id number] Exp: 09/2020

## 2018-06-29 NOTE — Patient Instructions (Addendum)
Cyanocobalamin, Vitamin B12 injection What is this medicine? CYANOCOBALAMIN (sye an oh koe BAL a min) is a man made form of vitamin B12. Vitamin B12 is used in the growth of healthy blood cells, nerve cells, and proteins in the body. It also helps with the metabolism of fats and carbohydrates. This medicine is used to treat people who can not absorb vitamin B12. This medicine may be used for other purposes; ask your health care provider or pharmacist if you have questions. COMMON BRAND NAME(S): B-12 Compliance Kit, B-12 Injection Kit, Cyomin, LA-12, Nutri-Twelve, Physicians EZ Use B-12, Primabalt What should I tell my health care provider before I take this medicine? They need to know if you have any of these conditions: -kidney disease -Leber's disease -megaloblastic anemia -an unusual or allergic reaction to cyanocobalamin, cobalt, other medicines, foods, dyes, or preservatives -pregnant or trying to get pregnant -breast-feeding How should I use this medicine? This medicine is injected into a muscle or deeply under the skin. It is usually given by a health care professional in a clinic or doctor's office. However, your doctor may teach you how to inject yourself. Follow all instructions. Talk to your pediatrician regarding the use of this medicine in children. Special care may be needed. Overdosage: If you think you have taken too much of this medicine contact a poison control center or emergency room at once. NOTE: This medicine is only for you. Do not share this medicine with others. What if I miss a dose? If you are given your dose at a clinic or doctor's office, call to reschedule your appointment. If you give your own injections and you miss a dose, take it as soon as you can. If it is almost time for your next dose, take only that dose. Do not take double or extra doses. What may interact with this medicine? -colchicine -heavy alcohol intake This list may not describe all possible  interactions. Give your health care provider a list of all the medicines, herbs, non-prescription drugs, or dietary supplements you use. Also tell them if you smoke, drink alcohol, or use illegal drugs. Some items may interact with your medicine. What should I watch for while using this medicine? Visit your doctor or health care professional regularly. You may need blood work done while you are taking this medicine. You may need to follow a special diet. Talk to your doctor. Limit your alcohol intake and avoid smoking to get the best benefit. What side effects may I notice from receiving this medicine? Side effects that you should report to your doctor or health care professional as soon as possible: -allergic reactions like skin rash, itching or hives, swelling of the face, lips, or tongue -blue tint to skin -chest tightness, pain -difficulty breathing, wheezing -dizziness -red, swollen painful area on the leg Side effects that usually do not require medical attention (report to your doctor or health care professional if they continue or are bothersome): -diarrhea -headache This list may not describe all possible side effects. Call your doctor for medical advice about side effects. You may report side effects to FDA at 1-800-FDA-1088. Where should I keep my medicine? Keep out of the reach of children. Store at room temperature between 15 and 30 degrees C (59 and 85 degrees F). Protect from light. Throw away any unused medicine after the expiration date. NOTE: This sheet is a summary. It may not cover all possible information. If you have questions about this medicine, talk to your doctor, pharmacist, or   health care provider.  2019 Elsevier/Gold Standard (2007-10-09 22:10:20) Phentermine orally disintegrating tablets (ODT) What is this medicine? PHENTERMINE (FEN ter meen) decreases your appetite. It is used with a reduced calorie diet and exercise to help you lose weight. This medicine may  be used for other purposes; ask your health care provider or pharmacist if you have questions. COMMON BRAND NAME(S): Suprenza What should I tell my health care provider before I take this medicine? They need to know if you have any of these conditions: -agitation or nervousness -diabetes -glaucoma -heart disease -high blood pressure -history of drug abuse or addiction -history of stroke -kidney disease -lung disease called Primary Pulmonary Hypertension (PPH) -taken an MAOI like Carbex, Eldepryl, Marplan, Nardil, or Parnate in last 14 days -taking stimulant medicines for attention disorders, weight loss, or to stay awake -thyroid disease -an unusual or allergic reaction to phentermine, other medicines, foods, dyes, or preservatives -pregnant or trying to get pregnant -breast-feeding How should I use this medicine? Take this medicine by mouth. Follow the directions on the prescription label. The tablets should stay in the bottle until immediately before you take your dose. Use dry hands to remove the dose from the bottle. Do not cut or split the tablets in half. Place the tablet in the mouth and allow it to dissolve, and then swallow. While you may take these tablets with water, it is not necessary to do so. Take your doses at regular intervals. Do not take your medicine more often than directed. Do not stop taking except on the advice of your doctor or health care professional. Talk to your pediatrician regarding the use of this medicine in children. Special care may be needed. Overdosage: If you think you have taken too much of this medicine contact a poison control center or emergency room at once. NOTE: This medicine is only for you. Do not share this medicine with others. What if I miss a dose? If you miss a dose, take it as soon as you can. If it is almost time for your next dose, take only that dose. Do not take double or extra doses. What may interact with this medicine? Do not take  this medicine with any of the following medications: -MAOIs like Carbex, Eldepryl, Marplan, Nardil, and Parnate -medicines for colds or breathing difficulties like pseudoephedrine or phenylephrine -procarbazine -sibutramine -stimulant medicines for attention disorders, weight loss, or to stay awake This medicine may also interact with the following medications: -certain medicines for depression, anxiety, or psychotic disturbances -linezolid -medicines for diabetes -medicines for high blood pressure This list may not describe all possible interactions. Give your health care provider a list of all the medicines, herbs, non-prescription drugs, or dietary supplements you use. Also tell them if you smoke, drink alcohol, or use illegal drugs. Some items may interact with your medicine. What should I watch for while using this medicine? Notify your physician immediately if you become short of breath while doing your normal activities. Do not take this medicine within 6 hours of bedtime. It can keep you from getting to sleep. Avoid drinks that contain caffeine and try to stick to a regular bedtime every night. This medicine is intended to be used in addition to a healthy diet and exercise. The best results are achieved this way. This medicine is only indicated for short-term use. Eventually your weight loss may level out. At that point, the drug will only help you maintain your new weight. Do not increase or in any way   change your dose without consulting your doctor. You may get drowsy or dizzy. Do not drive, use machinery, or do anything that needs mental alertness until you know how this medicine affects you. Do not stand or sit up quickly, especially if you are an older patient. This reduces the risk of dizzy or fainting spells. Alcohol may increase dizziness and drowsiness. Avoid alcoholic drinks. What side effects may I notice from receiving this medicine? Side effects that you should report to your  doctor or health care professional as soon as possible: -allergic reactions like skin rash, itching or hives, swelling of the face, lips, or tongue) -anxiety -breathing problems -changes in vision -chest pain or chest tightness -depressed mood or other mood changes -hallucinations, loss of contact with reality -fast, irregular heartbeat -increased blood pressure -irritable -nervousness or restlessness -painful urination -palpitations -tremors -trouble sleeping -seizures -signs and symptoms of a stroke like changes in vision; confusion; trouble speaking or understanding; severe headaches; sudden numbness or weakness of the face, arm or leg; trouble walking; dizziness; loss of balance or coordination -unusually weak or tired -vomiting Side effects that usually do not require medical attention (report to your doctor or health care professional if they continue or are bothersome): -constipation or diarrhea -dry mouth -headache -nausea -stomach upset -sweating This list may not describe all possible side effects. Call your doctor for medical advice about side effects. You may report side effects to FDA at 1-800-FDA-1088. Where should I keep my medicine? Keep out of the reach of children. This medicine can be abused. Keep your medicine in a safe place to protect it from theft. Do not share this medicine with anyone. Selling or giving away this medicine is dangerous and against the law. This medicine may cause accidental overdose and death if taken by other adults, children, or pets. Mix any unused medicine with a substance like cat litter or coffee grounds. Then throw the medicine away in a sealed container like a sealed bag or a coffee can with a lid. Do not use the medicine after the expiration date. Store at room temperature between 20 and 25 degrees C (68 and 77 degrees F). Keep container tightly closed. NOTE: This sheet is a summary. It may not cover all possible information. If you  have questions about this medicine, talk to your doctor, pharmacist, or health care provider.  2019 Elsevier/Gold Standard (2016-12-10 08:19:54) IUD PLACEMENT POST-PROCEDURE INSTRUCTIONS  1. You may take Ibuprofen, Aleve or Tylenol for pain if needed.  Cramping should resolve within in 24 hours.  2. You may have a small amount of spotting.  You should wear a mini pad for the next few days.  3. You may have intercourse after 24 hours.  If you using this for birth control, it is effective immediately.  4. You need to call if you have any pelvic pain, fever, heavy bleeding or foul smelling vaginal discharge.  Irregular bleeding is common the first several months after having an IUD placed. You do not need to call for this reason unless you are concerned.  5. Shower or bathe as normal  You should have a follow-up appointment in 4-8 weeks for a re-check to make sure you are not having any problems.  Levonorgestrel intrauterine device (IUD) What is this medicine? LEVONORGESTREL IUD (LEE voe nor jes trel) is a contraceptive (birth control) device. The device is placed inside the uterus by a healthcare professional. It is used to prevent pregnancy. This device can also be used to   treat heavy bleeding that occurs during your period. This medicine may be used for other purposes; ask your health care provider or pharmacist if you have questions. COMMON BRAND NAME(S): Kyleena, LILETTA, Mirena, Skyla What should I tell my health care provider before I take this medicine? They need to know if you have any of these conditions: -abnormal Pap smear -cancer of the breast, uterus, or cervix -diabetes -endometritis -genital or pelvic infection now or in the past -have more than one sexual partner or your partner has more than one partner -heart disease -history of an ectopic or tubal pregnancy -immune system problems -IUD in place -liver disease or tumor -problems with blood clots or take  blood-thinners -seizures -use intravenous drugs -uterus of unusual shape -vaginal bleeding that has not been explained -an unusual or allergic reaction to levonorgestrel, other hormones, silicone, or polyethylene, medicines, foods, dyes, or preservatives -pregnant or trying to get pregnant -breast-feeding How should I use this medicine? This device is placed inside the uterus by a health care professional. Talk to your pediatrician regarding the use of this medicine in children. Special care may be needed. Overdosage: If you think you have taken too much of this medicine contact a poison control center or emergency room at once. NOTE: This medicine is only for you. Do not share this medicine with others. What if I miss a dose? This does not apply. Depending on the brand of device you have inserted, the device will need to be replaced every 3 to 5 years if you wish to continue using this type of birth control. What may interact with this medicine? Do not take this medicine with any of the following medications: -amprenavir -bosentan -fosamprenavir This medicine may also interact with the following medications: -aprepitant -armodafinil -barbiturate medicines for inducing sleep or treating seizures -bexarotene -boceprevir -griseofulvin -medicines to treat seizures like carbamazepine, ethotoin, felbamate, oxcarbazepine, phenytoin, topiramate -modafinil -pioglitazone -rifabutin -rifampin -rifapentine -some medicines to treat HIV infection like atazanavir, efavirenz, indinavir, lopinavir, nelfinavir, tipranavir, ritonavir -St. John's wort -warfarin This list may not describe all possible interactions. Give your health care provider a list of all the medicines, herbs, non-prescription drugs, or dietary supplements you use. Also tell them if you smoke, drink alcohol, or use illegal drugs. Some items may interact with your medicine. What should I watch for while using this  medicine? Visit your doctor or health care professional for regular check ups. See your doctor if you or your partner has sexual contact with others, becomes HIV positive, or gets a sexual transmitted disease. This product does not protect you against HIV infection (AIDS) or other sexually transmitted diseases. You can check the placement of the IUD yourself by reaching up to the top of your vagina with clean fingers to feel the threads. Do not pull on the threads. It is a good habit to check placement after each menstrual period. Call your doctor right away if you feel more of the IUD than just the threads or if you cannot feel the threads at all. The IUD may come out by itself. You may become pregnant if the device comes out. If you notice that the IUD has come out use a backup birth control method like condoms and call your health care provider. Using tampons will not change the position of the IUD and are okay to use during your period. This IUD can be safely scanned with magnetic resonance imaging (MRI) only under specific conditions. Before you have an MRI, tell your healthcare   provider that you have an IUD in place, and which type of IUD you have in place. What side effects may I notice from receiving this medicine? Side effects that you should report to your doctor or health care professional as soon as possible: -allergic reactions like skin rash, itching or hives, swelling of the face, lips, or tongue -fever, flu-like symptoms -genital sores -high blood pressure -no menstrual period for 6 weeks during use -pain, swelling, warmth in the leg -pelvic pain or tenderness -severe or sudden headache -signs of pregnancy -stomach cramping -sudden shortness of breath -trouble with balance, talking, or walking -unusual vaginal bleeding, discharge -yellowing of the eyes or skin Side effects that usually do not require medical attention (report to your doctor or health care professional if they  continue or are bothersome): -acne -breast pain -change in sex drive or performance -changes in weight -cramping, dizziness, or faintness while the device is being inserted -headache -irregular menstrual bleeding within first 3 to 6 months of use -nausea This list may not describe all possible side effects. Call your doctor for medical advice about side effects. You may report side effects to FDA at 1-800-FDA-1088. Where should I keep my medicine? This does not apply. NOTE: This sheet is a summary. It may not cover all possible information. If you have questions about this medicine, talk to your doctor, pharmacist, or health care provider.  2019 Elsevier/Gold Standard (2016-04-09 14:14:56)  

## 2018-07-14 ENCOUNTER — Telehealth: Payer: Self-pay | Admitting: Certified Nurse Midwife

## 2018-07-14 NOTE — Telephone Encounter (Signed)
Spoke with patient in reference to [facility] faxing request to refill Sronyx.  Patient denies needing refill, had Mirena IUD placed 12/19.

## 2018-07-14 NOTE — Telephone Encounter (Signed)
The patient called and stated she missed a call from her nurse and would like a call back at the earliest convenience. Please advise.

## 2018-07-28 ENCOUNTER — Ambulatory Visit (INDEPENDENT_AMBULATORY_CARE_PROVIDER_SITE_OTHER): Payer: BLUE CROSS/BLUE SHIELD | Admitting: Certified Nurse Midwife

## 2018-07-28 ENCOUNTER — Encounter: Payer: Self-pay | Admitting: Certified Nurse Midwife

## 2018-07-28 VITALS — BP 119/87 | HR 96 | Ht 66.0 in | Wt 189.2 lb

## 2018-07-28 DIAGNOSIS — Z0130 Encounter for examination of blood pressure without abnormal findings: Secondary | ICD-10-CM | POA: Diagnosis not present

## 2018-07-28 DIAGNOSIS — R634 Abnormal weight loss: Secondary | ICD-10-CM

## 2018-07-28 DIAGNOSIS — Z30431 Encounter for routine checking of intrauterine contraceptive device: Secondary | ICD-10-CM

## 2018-07-28 DIAGNOSIS — T50905A Adverse effect of unspecified drugs, medicaments and biological substances, initial encounter: Secondary | ICD-10-CM

## 2018-07-28 DIAGNOSIS — Z975 Presence of (intrauterine) contraceptive device: Secondary | ICD-10-CM | POA: Insufficient documentation

## 2018-07-28 DIAGNOSIS — Z713 Dietary counseling and surveillance: Secondary | ICD-10-CM | POA: Diagnosis not present

## 2018-07-28 MED ORDER — CYANOCOBALAMIN 1000 MCG/ML IJ SOLN
1000.0000 ug | Freq: Once | INTRAMUSCULAR | Status: AC
  Administered 2018-07-28: 1000 ug via INTRAMUSCULAR

## 2018-07-28 NOTE — Patient Instructions (Addendum)
WE WOULD LOVE TO HEAR FROM YOU!!!!   Thank you Heather Mcgee for visiting [facility].  Providing our patients with the best experience possible is really important to us, and we hope that you felt that on your recent visit. The most valuable feedback we get comes from YOU!!    If you receive a survey please take a couple of minutes to let us know how we did.Thank you for continuing to trust us with your care.   [facility]    Phentermine tablets or capsules What is this medicine? PHENTERMINE (FEN ter meen) decreases your appetite. It is used with a reduced calorie diet and exercise to help you lose weight. This medicine may be used for other purposes; ask your health care provider or pharmacist if you have questions. COMMON BRAND NAME(S): Adipex-P, Atti-Plex P, Atti-Plex P Spansule, Fastin, Lomaira, Pro-Fast, Tara-8 What should I tell my health care provider before I take this medicine? They need to know if you have any of these conditions: -agitation or nervousness -diabetes -glaucoma -heart disease -high blood pressure -history of drug abuse or addiction -history of stroke -kidney disease -lung disease called Primary Pulmonary Hypertension (PPH) -taken an MAOI like Carbex, Eldepryl, Marplan, Nardil, or Parnate in last 14 days -taking stimulant medicines for attention disorders, weight loss, or to stay awake -thyroid disease -an unusual or allergic reaction to phentermine, other medicines, foods, dyes, or preservatives -pregnant or trying to get pregnant -breast-feeding How should I use this medicine? Take this medicine by mouth with a glass of water. Follow the directions on the prescription label. The instructions for use may differ based on the product and dose you are taking. Avoid taking this medicine in the evening. It may interfere with sleep. Take your doses at regular intervals. Do not take your medicine more often than directed. Talk to  your pediatrician regarding the use of this medicine in children. While this drug may be prescribed for children 17 years or older for selected conditions, precautions do apply. Overdosage: If you think you have taken too much of this medicine contact a poison control center or emergency room at once. NOTE: This medicine is only for you. Do not share this medicine with others. What if I miss a dose? If you miss a dose, take it as soon as you can. If it is almost time for your next dose, take only that dose. Do not take double or extra doses. What may interact with this medicine? Do not take this medicine with any of the following medications: -MAOIs like Carbex, Eldepryl, Marplan, Nardil, and Parnate -medicines for colds or breathing difficulties like pseudoephedrine or phenylephrine -procarbazine -sibutramine -stimulant medicines for attention disorders, weight loss, or to stay awake This medicine may also interact with the following medications: -certain medicines for depression, anxiety, or psychotic disturbances -linezolid -medicines for diabetes -medicines for high blood pressure This list may not describe all possible interactions. Give your health care provider a list of all the medicines, herbs, non-prescription drugs, or dietary supplements you use. Also tell them if you smoke, drink alcohol, or use illegal drugs. Some items may interact with your medicine. What should I watch for while using this medicine? Notify your physician immediately if you become short of breath while doing your normal activities. Do not take this medicine within 6 hours of bedtime. It can keep you from getting to sleep. Avoid drinks that contain caffeine and try to stick to a regular bedtime every night.  This medicine was intended to be used in addition to a healthy diet and exercise. The best results are achieved this way. This medicine is only indicated for short-term use. Eventually your weight loss may  level out. At that point, the drug will only help you maintain your new weight. Do not increase or in any way change your dose without consulting your doctor. You may get drowsy or dizzy. Do not drive, use machinery, or do anything that needs mental alertness until you know how this medicine affects you. Do not stand or sit up quickly, especially if you are an older patient. This reduces the risk of dizzy or fainting spells. Alcohol may increase dizziness and drowsiness. Avoid alcoholic drinks. What side effects may I notice from receiving this medicine? Side effects that you should report to your doctor or health care professional as soon as possible: -allergic reactions like skin rash, itching or hives, swelling of the face, lips, or tongue) -anxiety -breathing problems -changes in vision -chest pain or chest tightness -depressed mood or other mood changes -hallucinations, loss of contact with reality -fast, irregular heartbeat -increased blood pressure -irritable -nervousness or restlessness -painful urination -palpitations -tremors -trouble sleeping -seizures -signs and symptoms of a stroke like changes in vision; confusion; trouble speaking or understanding; severe headaches; sudden numbness or weakness of the face, arm or leg; trouble walking; dizziness; loss of balance or coordination -unusually weak or tired -vomiting Side effects that usually do not require medical attention (report to your doctor or health care professional if they continue or are bothersome): -constipation or diarrhea -dry mouth -headache -nausea -stomach upset -sweating This list may not describe all possible side effects. Call your doctor for medical advice about side effects. You may report side effects to FDA at 1-800-FDA-1088. Where should I keep my medicine? Keep out of the reach of children. This medicine can be abused. Keep your medicine in a safe place to protect it from theft. Do not share this  medicine with anyone. Selling or giving away this medicine is dangerous and against the law. This medicine may cause accidental overdose and death if taken by other adults, children, or pets. Mix any unused medicine with a substance like cat litter or coffee grounds. Then throw the medicine away in a sealed container like a sealed bag or a coffee can with a lid. Do not use the medicine after the expiration date. Store at room temperature between 20 and 25 degrees C (68 and 77 degrees F). Keep container tightly closed. NOTE: This sheet is a summary. It may not cover all possible information. If you have questions about this medicine, talk to your doctor, pharmacist, or health care provider.  2019 Elsevier/Gold Standard (2016-12-10 08:23:13) Levonorgestrel intrauterine device (IUD) What is this medicine? LEVONORGESTREL IUD (LEE voe nor jes trel) is a contraceptive (birth control) device. The device is placed inside the uterus by a healthcare professional. It is used to prevent pregnancy. This device can also be used to treat heavy bleeding that occurs during your period. This medicine may be used for other purposes; ask your health care provider or pharmacist if you have questions. COMMON BRAND NAME(S): Cameron AliKyleena, LILETTA, Mirena, Skyla What should I tell my health care provider before I take this medicine? They need to know if you have any of these conditions: -abnormal Pap smear -cancer of the breast, uterus, or cervix -diabetes -endometritis -genital or pelvic infection now or in the past -have more than one sexual partner or your partner  has more than one partner -heart disease -history of an ectopic or tubal pregnancy -immune system problems -IUD in place -liver disease or tumor -problems with blood clots or take blood-thinners -seizures -use intravenous drugs -uterus of unusual shape -vaginal bleeding that has not been explained -an unusual or allergic reaction to levonorgestrel, other  hormones, silicone, or polyethylene, medicines, foods, dyes, or preservatives -pregnant or trying to get pregnant -breast-feeding How should I use this medicine? This device is placed inside the uterus by a health care professional. Talk to your pediatrician regarding the use of this medicine in children. Special care may be needed. Overdosage: If you think you have taken too much of this medicine contact a poison control center or emergency room at once. NOTE: This medicine is only for you. Do not share this medicine with others. What if I miss a dose? This does not apply. Depending on the brand of device you have inserted, the device will need to be replaced every 3 to 5 years if you wish to continue using this type of birth control. What may interact with this medicine? Do not take this medicine with any of the following medications: -amprenavir -bosentan -fosamprenavir This medicine may also interact with the following medications: -aprepitant -armodafinil -barbiturate medicines for inducing sleep or treating seizures -bexarotene -boceprevir -griseofulvin -medicines to treat seizures like carbamazepine, ethotoin, felbamate, oxcarbazepine, phenytoin, topiramate -modafinil -pioglitazone -rifabutin -rifampin -rifapentine -some medicines to treat HIV infection like atazanavir, efavirenz, indinavir, lopinavir, nelfinavir, tipranavir, ritonavir -St. John's wort -warfarin This list may not describe all possible interactions. Give your health care provider a list of all the medicines, herbs, non-prescription drugs, or dietary supplements you use. Also tell them if you smoke, drink alcohol, or use illegal drugs. Some items may interact with your medicine. What should I watch for while using this medicine? Visit your doctor or health care professional for regular check ups. See your doctor if you or your partner has sexual contact with others, becomes HIV positive, or gets a sexual  transmitted disease. This product does not protect you against HIV infection (AIDS) or other sexually transmitted diseases. You can check the placement of the IUD yourself by reaching up to the top of your vagina with clean fingers to feel the threads. Do not pull on the threads. It is a good habit to check placement after each menstrual period. Call your doctor right away if you feel more of the IUD than just the threads or if you cannot feel the threads at all. The IUD may come out by itself. You may become pregnant if the device comes out. If you notice that the IUD has come out use a backup birth control method like condoms and call your health care provider. Using tampons will not change the position of the IUD and are okay to use during your period. This IUD can be safely scanned with magnetic resonance imaging (MRI) only under specific conditions. Before you have an MRI, tell your healthcare provider that you have an IUD in place, and which type of IUD you have in place. What side effects may I notice from receiving this medicine? Side effects that you should report to your doctor or health care professional as soon as possible: -allergic reactions like skin rash, itching or hives, swelling of the face, lips, or tongue -fever, flu-like symptoms -genital sores -high blood pressure -no menstrual period for 6 weeks during use -pain, swelling, warmth in the leg -pelvic pain or tenderness -severe or sudden  headache -signs of pregnancy -stomach cramping -sudden shortness of breath -trouble with balance, talking, or walking -unusual vaginal bleeding, discharge -yellowing of the eyes or skin Side effects that usually do not require medical attention (report to your doctor or health care professional if they continue or are bothersome): -acne -breast pain -change in sex drive or performance -changes in weight -cramping, dizziness, or faintness while the device is being  inserted -headache -irregular menstrual bleeding within first 3 to 6 months of use -nausea This list may not describe all possible side effects. Call your doctor for medical advice about side effects. You may report side effects to FDA at 1-800-FDA-1088. Where should I keep my medicine? This does not apply. NOTE: This sheet is a summary. It may not cover all possible information. If you have questions about this medicine, talk to your doctor, pharmacist, or health care provider.  2019 Elsevier/Gold Standard (2016-04-09 14:14:56)  Constipation, Adult Constipation is when a person:  Poops (has a bowel movement) fewer times in a week than normal.  Has a hard time pooping.  Has poop that is dry, hard, or bigger than normal. Follow these instructions at home: Eating and drinking   Eat foods that have a lot of fiber, such as: ? Fresh fruits and vegetables. ? Whole grains. ? Beans.  Eat less of foods that are high in fat, low in fiber, or overly processed, such as: ? [place] fries. ? Hamburgers. ? Cookies. ? Candy. ? Soda.  Drink enough fluid to keep your pee (urine) clear or pale yellow. General instructions  Exercise regularly or as told by your doctor.  Go to the restroom when you feel like you need to poop. Do not hold it in.  Take over-the-counter and prescription medicines only as told by your doctor. These include any fiber supplements.  Do pelvic floor retraining exercises, such as: ? Doing deep breathing while relaxing your lower belly (abdomen). ? Relaxing your pelvic floor while pooping.  Watch your condition for any changes.  Keep all follow-up visits as told by your doctor. This is important. Contact a doctor if:  You have pain that gets worse.  You have a fever.  You have not pooped for 4 days.  You throw up (vomit).  You are not hungry.  You lose weight.  You are bleeding from the anus.  You have thin, pencil-like poop (stool). Get help right  away if:  You have a fever, and your symptoms suddenly get worse.  You leak poop or have blood in your poop.  Your belly feels hard or bigger than normal (is bloated).  You have very bad belly pain.  You feel dizzy or you faint. This information is not intended to replace advice given to you by your health care provider. Make sure you discuss any questions you have with your health care provider. Document Released: 12/15/2007 Document Revised: 01/16/2016 Document Reviewed: 12/17/2015 Elsevier Interactive Patient Education  2019 [employer].

## 2018-07-28 NOTE — Progress Notes (Signed)
  GYNECOLOGY OFFICE ENCOUNTER NOTE  History:  38 y.o. G1P1001 here today for today for IUD string check; Mirena  IUD was placed 06/29/2018. No complaints about the IUD, no concerning side effects.  Reports constipation for the last week and possible hemorrhoid, using preparation-h with moderate relief.   Denies difficulty breathing or respiratory distress, chest pain, abdominal pain, excessive vaginal bleeding, dysuria, and leg pain or swelling.   The following portions of the patient's history were reviewed and updated as appropriate: allergies, current medications, past family history, past medical history, past social history, past surgical history and problem list. Last pap smear on 2018 was normal, negative HRHPV.  Review of Systems:   Pertinent items are noted in HPI.  Objective:   BP 119/87   Pulse 96   Ht 5\' 6"  (1.676 m)   Wt 189 lb 3.2 oz (85.8 kg)   LMP 07/25/2018 (Exact Date)   BMI 30.54 kg/m   Physical Exam  CONSTITUTIONAL: Well-developed, well-nourished female in no acute distress.   ABDOMEN: Soft, no distention noted.    PELVIC: Normal appearing external genitalia; normal appearing vaginal mucosa and cervix.  IUD strings visualized, about 3 cm in length outside cervix.   Assessment & Plan:   Patient to keep IUD in place for up to seven years; can come in for removal if she desires pregnancy earlier or for or concerning side effects.  B-12 injection today, see orders.   Continue phentermine as prescribed, see chart. Praise given for recent weight loss.   Education regarding home treatment measures for constipation, see AVS.   Reviewed red flag symptoms and when to call.   RTC x 1 month for weight management visit or sooner if needed.    Gunnar Bulla, CNM [facility], [facility] 07/28/18 10:04 AM

## 2018-07-28 NOTE — Progress Notes (Signed)
Patient here for Mirena IUD string check and BP/weight check/B12 injection.  Patient c/o right sided pelvic pain with physical activity that started Monday, no pain today.  Patient also c/o constipation and rectal bleeding with BM last week, using preparation H suppositories and wipes with some relief.   Heather Mcgee presents for weight, B/P and B-12 injection. No side effects of medications-Phentermine or B-12. Weight loss 6 lbs. Encouraged eating healthy and exercise.

## 2018-08-25 ENCOUNTER — Ambulatory Visit (INDEPENDENT_AMBULATORY_CARE_PROVIDER_SITE_OTHER): Payer: BLUE CROSS/BLUE SHIELD | Admitting: Certified Nurse Midwife

## 2018-08-25 ENCOUNTER — Encounter: Payer: Self-pay | Admitting: Certified Nurse Midwife

## 2018-08-25 VITALS — BP 126/87 | HR 79 | Ht 66.0 in | Wt 189.0 lb

## 2018-08-25 DIAGNOSIS — Z0130 Encounter for examination of blood pressure without abnormal findings: Secondary | ICD-10-CM

## 2018-08-25 DIAGNOSIS — Z713 Dietary counseling and surveillance: Secondary | ICD-10-CM | POA: Diagnosis not present

## 2018-08-25 DIAGNOSIS — R5383 Other fatigue: Secondary | ICD-10-CM | POA: Diagnosis not present

## 2018-08-25 DIAGNOSIS — T50905A Adverse effect of unspecified drugs, medicaments and biological substances, initial encounter: Principal | ICD-10-CM

## 2018-08-25 DIAGNOSIS — R634 Abnormal weight loss: Secondary | ICD-10-CM

## 2018-08-25 MED ORDER — PHENTERMINE HCL 37.5 MG PO TABS: 18.7500 mg | ORAL_TABLET | Freq: Two times a day (BID) | ORAL | 0 refills | Status: DC

## 2018-08-25 MED ORDER — CYANOCOBALAMIN 1000 MCG/ML IJ SOLN
1000.0000 ug | Freq: Once | INTRAMUSCULAR | Status: AC
  Administered 2018-08-25: 1000 ug via INTRAMUSCULAR

## 2018-08-25 NOTE — Progress Notes (Signed)
Heather Mcgee presents for weight, B/P and B-12 injection. No side effects of medications-Phentermine or B-12. Weight loss 0.3 lbs. Encouraged eating healthy and exercise.   Patient c/o feeling "very hungry by noon".  Patient states she is under a lot of stress.  Denies difficulty breathing or respiratory distress, chest pain, abdominal pain, excessive vaginal bleeding dysuria, and leg pain or swelling.   Objective:  BP 126/87   Pulse 79   Ht 5\' 6"  (1.676 m)   Wt 189 lb (85.7 kg)   LMP 07/25/2018 (Exact Date)   BMI 30.51 kg/m   General: Alert and oriented x 4, no apparent distress.   Abdomen: Waist circumference greater than 36 inches.   GAD 7 : Generalized Anxiety Score 08/25/2018  Nervous, Anxious, on Edge 2  Control/stop worrying 0  Worry too much - different things 3  Trouble relaxing 3  Restless 3  Easily annoyed or irritable 1  Afraid - awful might happen 0  Total GAD 7 Score 12  Anxiety Difficulty Extremely difficult   Depression screen [facility] 2/9 08/25/2018 02/03/2018  Decreased Interest 0 1  Down, Depressed, Hopeless 0 1  PHQ - 2 Score 0 2  Altered sleeping 3 3  Tired, decreased energy 2 3  Change in appetite 3 2  Feeling bad or failure about yourself  0 0  Trouble concentrating 2 3  Moving slowly or fidgety/restless 2 0  Suicidal thoughts 0 0  PHQ-9 Score 12 13  Difficult doing work/chores Very difficult -    Assessment:   Weight management visit  Blood pressure check  B-12 injections  Plan:  Will changes to tablets, so patient can take half in morning and half in afternoon, see Rx.    Declines counseling or therapy at this time.   Encouraged lifestyle modifications.   Reviewed red flag symptoms and when to call.   RTC x 4-5 weeks for weight management visit or sooner if needed.    Gunnar Bulla, CNM [facility], [facility] 08/25/18 12:43 PM

## 2018-08-25 NOTE — Patient Instructions (Signed)
WE WOULD LOVE TO HEAR FROM YOU!!!!   Thank you Heather Gouty Seaberry for visiting [facility].  Providing our patients with the best experience possible is really important to [place], and we hope that you felt that on your recent visit. The most valuable feedback we get comes from YOU!!    If you receive a survey please take a couple of minutes to let us know how we did.Thank you for continuing to trust [place] with your care.   [facility]   Phentermine tablets or capsules What is this medicine? PHENTERMINE (FEN ter meen) decreases your appetite. It is used with a reduced calorie diet and exercise to help you lose weight. This medicine may be used for other purposes; ask your health care provider or pharmacist if you have questions. COMMON BRAND NAME(S): Adipex-P, Atti-Plex P, Atti-Plex P Spansule, Fastin, Lomaira, Pro-Fast, Tara-8 What should I tell my health care provider before I take this medicine? They need to know if you have any of these conditions: -agitation or nervousness -diabetes -glaucoma -heart disease -high blood pressure -history of drug abuse or addiction -history of stroke -kidney disease -lung disease called Primary Pulmonary Hypertension (PPH) -taken an MAOI like Carbex, Eldepryl, Marplan, Nardil, or Parnate in last 14 days -taking stimulant medicines for attention disorders, weight loss, or to stay awake -thyroid disease -an unusual or allergic reaction to phentermine, other medicines, foods, dyes, or preservatives -pregnant or trying to get pregnant -breast-feeding How should I use this medicine? Take this medicine by mouth with a glass of water. Follow the directions on the prescription label. The instructions for use may differ based on the product and dose you are taking. Avoid taking this medicine in the evening. It may interfere with sleep. Take your doses at regular intervals. Do not take your medicine more often than  directed. Talk to your pediatrician regarding the use of this medicine in children. While this drug may be prescribed for children 17 years or older for selected conditions, precautions do apply. Overdosage: If you think you have taken too much of this medicine contact a poison control center or emergency room at once. NOTE: This medicine is only for you. Do not share this medicine with others. What if I miss a dose? If you miss a dose, take it as soon as you can. If it is almost time for your next dose, take only that dose. Do not take double or extra doses. What may interact with this medicine? Do not take this medicine with any of the following medications: -MAOIs like Carbex, Eldepryl, Marplan, Nardil, and Parnate -medicines for colds or breathing difficulties like pseudoephedrine or phenylephrine -procarbazine -sibutramine -stimulant medicines for attention disorders, weight loss, or to stay awake This medicine may also interact with the following medications: -certain medicines for depression, anxiety, or psychotic disturbances -linezolid -medicines for diabetes -medicines for high blood pressure This list may not describe all possible interactions. Give your health care provider a list of all the medicines, herbs, non-prescription drugs, or dietary supplements you use. Also tell them if you smoke, drink alcohol, or use illegal drugs. Some items may interact with your medicine. What should I watch for while using this medicine? Notify your physician immediately if you become short of breath while doing your normal activities. Do not take this medicine within 6 hours of bedtime. It can keep you from getting to sleep. Avoid drinks that contain caffeine and try to stick to a regular bedtime every night. This  medicine was intended to be used in addition to a healthy diet and exercise. The best results are achieved this way. This medicine is only indicated for short-term use. Eventually your  weight loss may level out. At that point, the drug will only help you maintain your new weight. Do not increase or in any way change your dose without consulting your doctor. You may get drowsy or dizzy. Do not drive, use machinery, or do anything that needs mental alertness until you know how this medicine affects you. Do not stand or sit up quickly, especially if you are an older patient. This reduces the risk of dizzy or fainting spells. Alcohol may increase dizziness and drowsiness. Avoid alcoholic drinks. What side effects may I notice from receiving this medicine? Side effects that you should report to your doctor or health care professional as soon as possible: -allergic reactions like skin rash, itching or hives, swelling of the face, lips, or tongue) -anxiety -breathing problems -changes in vision -chest pain or chest tightness -depressed mood or other mood changes -hallucinations, loss of contact with reality -fast, irregular heartbeat -increased blood pressure -irritable -nervousness or restlessness -painful urination -palpitations -tremors -trouble sleeping -seizures -signs and symptoms of a stroke like changes in vision; confusion; trouble speaking or understanding; severe headaches; sudden numbness or weakness of the face, arm or leg; trouble walking; dizziness; loss of balance or coordination -unusually weak or tired -vomiting Side effects that usually do not require medical attention (report to your doctor or health care professional if they continue or are bothersome): -constipation or diarrhea -dry mouth -headache -nausea -stomach upset -sweating This list may not describe all possible side effects. Call your doctor for medical advice about side effects. You may report side effects to FDA at 1-800-FDA-1088. Where should I keep my medicine? Keep out of the reach of children. This medicine can be abused. Keep your medicine in a safe place to protect it from theft. Do  not share this medicine with anyone. Selling or giving away this medicine is dangerous and against the law. This medicine may cause accidental overdose and death if taken by other adults, children, or pets. Mix any unused medicine with a substance like cat litter or coffee grounds. Then throw the medicine away in a sealed container like a sealed bag or a coffee can with a lid. Do not use the medicine after the expiration date. Store at room temperature between 20 and 25 degrees C (68 and 77 degrees F). Keep container tightly closed. NOTE: This sheet is a summary. It may not cover all possible information. If you have questions about this medicine, talk to your doctor, pharmacist, or health care provider.  2019 Elsevier/Gold Standard (2016-12-10 08:23:13)

## 2018-09-11 ENCOUNTER — Other Ambulatory Visit: Payer: Self-pay | Admitting: Certified Nurse Midwife

## 2018-09-22 ENCOUNTER — Other Ambulatory Visit: Payer: Self-pay

## 2018-09-22 ENCOUNTER — Encounter: Payer: Self-pay | Admitting: Certified Nurse Midwife

## 2018-09-22 ENCOUNTER — Ambulatory Visit (INDEPENDENT_AMBULATORY_CARE_PROVIDER_SITE_OTHER): Payer: BLUE CROSS/BLUE SHIELD | Admitting: Certified Nurse Midwife

## 2018-09-22 VITALS — BP 120/80 | HR 86 | Ht 67.0 in | Wt 183.2 lb

## 2018-09-22 DIAGNOSIS — Z713 Dietary counseling and surveillance: Secondary | ICD-10-CM | POA: Diagnosis not present

## 2018-09-22 DIAGNOSIS — R634 Abnormal weight loss: Secondary | ICD-10-CM

## 2018-09-22 DIAGNOSIS — T50905A Adverse effect of unspecified drugs, medicaments and biological substances, initial encounter: Principal | ICD-10-CM

## 2018-09-22 MED ORDER — CYANOCOBALAMIN 1000 MCG/ML IJ SOLN
1000.0000 ug | Freq: Once | INTRAMUSCULAR | Status: AC
  Administered 2018-09-22: 1000 ug via INTRAMUSCULAR

## 2018-09-22 NOTE — Patient Instructions (Signed)
Preventive Care 18-39 Years, Female Preventive care refers to lifestyle choices and visits with your health care provider that can promote health and wellness. What does preventive care include?   A yearly physical exam. This is also called an annual well check.  Dental exams once or twice a year.  Routine eye exams. Ask your health care provider how often you should have your eyes checked.  Personal lifestyle choices, including: ? Daily care of your teeth and gums. ? Regular physical activity. ? Eating a healthy diet. ? Avoiding tobacco and drug use. ? Limiting alcohol use. ? Practicing safe sex. ? Taking vitamin and mineral supplements as recommended by your health care provider. What happens during an annual well check? The services and screenings done by your health care provider during your annual well check will depend on your age, overall health, lifestyle risk factors, and family history of disease. Counseling Your health care provider may ask you questions about your:  Alcohol use.  Tobacco use.  Drug use.  Emotional well-being.  Home and relationship well-being.  Sexual activity.  Eating habits.  Work and work environment.  Method of birth control.  Menstrual cycle.  Pregnancy history. Screening You may have the following tests or measurements:  Height, weight, and BMI.  Diabetes screening. This is done by checking your blood sugar (glucose) after you have not eaten for a while (fasting).  Blood pressure.  Lipid and cholesterol levels. These may be checked every 5 years starting at age 20.  Skin check.  Hepatitis C blood test.  Hepatitis B blood test.  Sexually transmitted disease (STD) testing.  BRCA-related cancer screening. This may be done if you have a family history of breast, ovarian, tubal, or peritoneal cancers.  Pelvic exam and Pap test. This may be done every 3 years starting at age 21. Starting at age 30, this may be done every 5  years if you have a Pap test in combination with an HPV test. Discuss your test results, treatment options, and if necessary, the need for more tests with your health care provider. Vaccines Your health care provider may recommend certain vaccines, such as:  Influenza vaccine. This is recommended every year.  Tetanus, diphtheria, and acellular pertussis (Tdap, Td) vaccine. You may need a Td booster every 10 years.  Varicella vaccine. You may need this if you have not been vaccinated.  HPV vaccine. If you are 26 or younger, you may need three doses over 6 months.  Measles, mumps, and rubella (MMR) vaccine. You may need at least one dose of MMR. You may also need a second dose.  Pneumococcal 13-valent conjugate (PCV13) vaccine. You may need this if you have certain conditions and were not previously vaccinated.  Pneumococcal polysaccharide (PPSV23) vaccine. You may need one or two doses if you smoke cigarettes or if you have certain conditions.  Meningococcal vaccine. One dose is recommended if you are age 19-21 years and a first-year college student living in a residence hall, or if you have one of several medical conditions. You may also need additional booster doses.  Hepatitis A vaccine. You may need this if you have certain conditions or if you travel or work in places where you may be exposed to hepatitis A.  Hepatitis B vaccine. You may need this if you have certain conditions or if you travel or work in places where you may be exposed to hepatitis B.  Haemophilus influenzae type b (Hib) vaccine. You may need this if you   have certain risk factors. Talk to your health care provider about which screenings and vaccines you need and how often you need them. This information is not intended to replace advice given to you by your health care provider. Make sure you discuss any questions you have with your health care provider. Document Released: 08/24/2001 Document Revised: 02/08/2017  Document Reviewed: 04/29/2015 Elsevier Interactive Patient Education  2019 [employer].

## 2018-09-22 NOTE — Progress Notes (Signed)
Waist 33.5 in Pt states she couldn't take the phentermine made her very agitated

## 2018-09-25 NOTE — Progress Notes (Signed)
I have reviewed the record and concur with patient management and plan of care.    Zayyan Mullen Michelle Leroy Trim, CNM [facility], [facility] 

## 2018-09-27 ENCOUNTER — Other Ambulatory Visit: Payer: Self-pay

## 2018-09-27 MED ORDER — CYANOCOBALAMIN 1000 MCG/ML IJ SOLN: 1000.0000 ug | INTRAMUSCULAR | 3 refills | Status: DC

## 2019-04-24 ENCOUNTER — Encounter: Payer: Self-pay | Admitting: Certified Nurse Midwife

## 2019-04-27 ENCOUNTER — Encounter: Payer: Self-pay | Admitting: Certified Nurse Midwife

## 2019-04-27 ENCOUNTER — Other Ambulatory Visit: Payer: Self-pay

## 2019-04-27 ENCOUNTER — Ambulatory Visit (INDEPENDENT_AMBULATORY_CARE_PROVIDER_SITE_OTHER): Payer: BC Managed Care – PPO | Admitting: Certified Nurse Midwife

## 2019-04-27 VITALS — BP 102/67 | HR 75 | Ht 67.0 in | Wt 175.5 lb

## 2019-04-27 DIAGNOSIS — Z30432 Encounter for removal of intrauterine contraceptive device: Secondary | ICD-10-CM | POA: Diagnosis not present

## 2019-04-27 NOTE — Progress Notes (Signed)
Patient here for Mirena IUD removal, will like to discuss different contraceptive method.

## 2019-04-27 NOTE — Progress Notes (Signed)
Heather Mcgee is a 38 y.o. year old [id number] Caucasian female who presents for removal of a Mirena IUD. Her Mirena IUD was placed 06/2018.    BP 102/67   Pulse 75   Ht 5\' 7"  (1.702 m)   Wt 175 lb 8 oz (79.6 kg)   LMP 04/15/2019 (Exact Date)   BMI 27.49 kg/m   Time out was performed.  A small plastic speculum was placed in the vagina.  The cervix was visualized, and the strings were visible. They were grasped and the Mirena was easily removed intact without complications.   Samples of Lo Loestrin given. Patient to call for full prescription of Nexplanon insertion as desired.   Reviewed red flag symptoms and when to call.   RTC as needed.    Diona Fanti, CNM [facility], [facility] 04/27/19 3:18 PM

## 2019-04-27 NOTE — Patient Instructions (Signed)
Etonogestrel implant What is this medicine? ETONOGESTREL (et oh noe JES trel) is a contraceptive (birth control) device. It is used to prevent pregnancy. It can be used for up to 3 years. This medicine may be used for other purposes; ask your health care provider or pharmacist if you have questions. COMMON BRAND NAME(S): Implanon, Nexplanon What should I tell my health care provider before I take this medicine? They need to know if you have any of these conditions:  abnormal vaginal bleeding  blood vessel disease or blood clots  breast, cervical, endometrial, ovarian, liver, or uterine cancer  diabetes  gallbladder disease  heart disease or recent heart attack  high blood pressure  high cholesterol or triglycerides  kidney disease  liver disease  migraine headaches  seizures  stroke  tobacco smoker  an unusual or allergic reaction to etonogestrel, anesthetics or antiseptics, other medicines, foods, dyes, or preservatives  pregnant or trying to get pregnant  breast-feeding How should I use this medicine? This device is inserted just under the skin on the inner side of your upper arm by a health care professional. Talk to your pediatrician regarding the use of this medicine in children. Special care may be needed. Overdosage: If you think you have taken too much of this medicine contact a poison control center or emergency room at once. NOTE: This medicine is only for you. Do not share this medicine with others. What if I miss a dose? This does not apply. What may interact with this medicine? Do not take this medicine with any of the following medications:  amprenavir  fosamprenavir This medicine may also interact with the following medications:  acitretin  aprepitant  armodafinil  bexarotene  bosentan  carbamazepine  certain medicines for fungal infections like fluconazole, ketoconazole, itraconazole and voriconazole  certain medicines to treat  hepatitis, HIV or AIDS  cyclosporine  felbamate  griseofulvin  lamotrigine  modafinil  oxcarbazepine  phenobarbital  phenytoin  primidone  rifabutin  rifampin  rifapentine  St. John's wort  topiramate This list may not describe all possible interactions. Give your health care provider a list of all the medicines, herbs, non-prescription drugs, or dietary supplements you use. Also tell them if you smoke, drink alcohol, or use illegal drugs. Some items may interact with your medicine. What should I watch for while using this medicine? This product does not protect you against HIV infection (AIDS) or other sexually transmitted diseases. You should be able to feel the implant by pressing your fingertips over the skin where it was inserted. Contact your doctor if you cannot feel the implant, and use a non-hormonal birth control method (such as condoms) until your doctor confirms that the implant is in place. Contact your doctor if you think that the implant may have broken or become bent while in your arm. You will receive a user card from your health care provider after the implant is inserted. The card is a record of the location of the implant in your upper arm and when it should be removed. Keep this card with your health records. What side effects may I notice from receiving this medicine? Side effects that you should report to your doctor or health care professional as soon as possible:  allergic reactions like skin rash, itching or hives, swelling of the face, lips, or tongue  breast lumps, breast tissue changes, or discharge  breathing problems  changes in emotions or moods  if you feel that the implant may have broken or   bent while in your arm  high blood pressure  pain, irritation, swelling, or bruising at the insertion site  scar at site of insertion  signs of infection at the insertion site such as fever, and skin redness, pain or discharge  signs and  symptoms of a blood clot such as breathing problems; changes in vision; chest pain; severe, sudden headache; pain, swelling, warmth in the leg; trouble speaking; sudden numbness or weakness of the face, arm or leg  signs and symptoms of liver injury like dark yellow or brown urine; general ill feeling or flu-like symptoms; light-colored stools; loss of appetite; nausea; right upper belly pain; unusually weak or tired; yellowing of the eyes or skin  unusual vaginal bleeding, discharge Side effects that usually do not require medical attention (report to your doctor or health care professional if they continue or are bothersome):  acne  breast pain or tenderness  headache  irregular menstrual bleeding  nausea This list may not describe all possible side effects. Call your doctor for medical advice about side effects. You may report side effects to FDA at 1-800-FDA-1088. Where should I keep my medicine? This drug is given in a hospital or clinic and will not be stored at home. NOTE: This sheet is a summary. It may not cover all possible information. If you have questions about this medicine, talk to your doctor, pharmacist, or health care provider.  2020 Elsevier/Gold Standard (2017-05-17 14:11:42) Ethinyl Estradiol; Norethindrone Acetate; Ferrous fumarate tablets or capsules What is this medicine? ETHINYL ESTRADIOL; NORETHINDRONE ACETATE; FERROUS FUMARATE (ETH in il es tra DYE ole; nor eth IN drone AS e tate; FER [place] FUE ma rate) is an oral contraceptive. The products combine two types of female hormones, an estrogen and a progestin. They are used to prevent ovulation and pregnancy. Some products are also used to treat acne in females. This medicine may be used for other purposes; ask your health care provider or pharmacist if you have questions. COMMON BRAND NAME(S): Aurovela [address] 1/20, Aurovela Fe, [place] [address], [address] Fe, [place] Fe, [place] [address], [address] Fe 1.5/30, [place] Fe 1/20,  Hailey 24 Fe, Junel Fe 1.5/30, Junel Fe 1/20, Junel Fe 24, [place] Fe, Lo Loestrin Fe, Loestrin 24 Fe, Loestrin FE 1.5/30, Loestrin FE 1/20, Lomedia 24 Fe, Microgestin 24 Fe, Microgestin Fe 1.5/30, Microgestin Fe 1/20, Tarina 24 Fe, Tarina Fe 1/20, [place], Tilia Fe, Tri-Legest Fe What should I tell my health care provider before I take this medicine? They need to know if you have any of these conditions:  abnormal vaginal bleeding  blood vessel disease  breast, cervical, endometrial, ovarian, liver, or uterine cancer  diabetes  gallbladder disease  heart disease or recent heart attack  high blood pressure  high cholesterol  history of blood clots  kidney disease  liver disease  migraine headaches  smoke tobacco  stroke  systemic lupus erythematosus (SLE)  an unusual or allergic reaction to estrogens, progestins, other medicines, foods, dyes, or preservatives  pregnant or trying to get pregnant  breast-feeding How should I use this medicine? Take this medicine by mouth. To reduce nausea, this medicine may be taken with food. Follow the directions on the prescription label. Take this medicine at the same time each day and in the order directed on the package. Do not take your medicine more often than directed. A patient package insert for the product will be given with each prescription and refill. Read this sheet carefully each time. The sheet may  change frequently. Contact your pediatrician regarding the use of this medicine in children. Special care may be needed. This medicine has been used in female children who have started having menstrual periods. Overdosage: If you think you have taken too much of this medicine contact a poison control center or emergency room at once. NOTE: This medicine is only for you. Do not share this medicine with others. What if I miss a dose? If you miss a dose, refer to the patient information sheet you received with your medicine for  direction. If you miss more than one pill, this medicine may not be as effective and you may need to use another form of birth control. What may interact with this medicine? Do not take this medicine with the following medication:  dasabuvir; ombitasvir; paritaprevir; ritonavir  ombitasvir; paritaprevir; ritonavir This medicine may also interact with the following medications:  acetaminophen  antibiotics or medicines for infections, especially rifampin, rifabutin, rifapentine, and griseofulvin, and possibly penicillins or tetracyclines  aprepitant  ascorbic acid (vitamin C)  atorvastatin  barbiturate medicines, such as phenobarbital  bosentan  carbamazepine  caffeine  clofibrate  cyclosporine  dantrolene  doxercalciferol  felbamate  grapefruit juice  hydrocortisone  medicines for anxiety or sleeping problems, such as diazepam or temazepam  medicines for diabetes, including pioglitazone  mineral oil  modafinil  mycophenolate  nefazodone  oxcarbazepine  phenytoin  prednisolone  ritonavir or other medicines for HIV infection or AIDS  rosuvastatin  selegiline  soy isoflavones supplements  St. John's wort  tamoxifen or raloxifene  theophylline  thyroid hormones  topiramate  warfarin This list may not describe all possible interactions. Give your health care provider a list of all the medicines, herbs, non-prescription drugs, or dietary supplements you use. Also tell them if you smoke, drink alcohol, or use illegal drugs. Some items may interact with your medicine. What should I watch for while using this medicine? Visit your doctor or health care professional for regular checks on your progress. You will need a regular breast and pelvic exam and Pap smear while on this medicine. Use an additional method of contraception during the first cycle that you take these tablets. If you have any reason to think you are pregnant, stop taking this  medicine right away and contact your doctor or health care professional. If you are taking this medicine for hormone related problems, it may take several cycles of use to see improvement in your condition. Smoking increases the risk of getting a blood clot or having a stroke while you are taking birth control pills, especially if you are more than 39 years old. You are strongly advised not to smoke. This medicine can make your body retain fluid, making your fingers, hands, or ankles swell. Your blood pressure can go up. Contact your doctor or health care professional if you feel you are retaining fluid. This medicine can make you more sensitive to the sun. Keep out of the sun. If you cannot avoid being in the sun, wear protective clothing and use sunscreen. Do not use sun lamps or tanning beds/booths. If you wear contact lenses and notice visual changes, or if the lenses begin to feel uncomfortable, consult your eye care specialist. In some women, tenderness, swelling, or minor bleeding of the gums may occur. Notify your dentist if this happens. Brushing and flossing your teeth regularly may help limit this. See your dentist regularly and inform your dentist of the medicines you are taking. If you are going to have  elective surgery, you may need to stop taking this medicine before the surgery. Consult your health care professional for advice. This medicine does not protect you against HIV infection (AIDS) or any other sexually transmitted diseases. What side effects may I notice from receiving this medicine? Side effects that you should report to your doctor or health care professional as soon as possible:  allergic reactions like skin rash, itching or hives, swelling of the face, lips, or tongue  breast tissue changes or discharge  changes in vaginal bleeding during your period or between your periods  changes in vision  chest pain  confusion  coughing up blood  dizziness  feeling faint  or lightheaded  headaches or migraines  leg, arm or groin pain  loss of balance or coordination  severe or sudden headaches  stomach pain (severe)  sudden shortness of breath  sudden numbness or weakness of the face, arm or leg  symptoms of vaginal infection like itching, irritation or unusual discharge  tenderness in the upper abdomen  trouble speaking or understanding  vomiting  yellowing of the eyes or skin Side effects that usually do not require medical attention (report to your doctor or health care professional if they continue or are bothersome):  breakthrough bleeding and spotting that continues beyond the 3 initial cycles of pills  breast tenderness  mood changes, anxiety, depression, frustration, anger, or emotional outbursts  increased sensitivity to sun or ultraviolet light  nausea  skin rash, acne, or brown spots on the skin  weight gain (slight) This list may not describe all possible side effects. Call your doctor for medical advice about side effects. You may report side effects to FDA at 1-800-FDA-1088. Where should I keep my medicine? Keep out of the reach of children. Store at room temperature between 15 and 30 degrees C (59 and 86 degrees F). Throw away any unused medicine after the expiration date. NOTE: This sheet is a summary. It may not cover all possible information. If you have questions about this medicine, talk to your doctor, pharmacist, or health care provider.  2020 Elsevier/Gold Standard (2016-03-08 08:04:41)

## 2019-05-21 ENCOUNTER — Encounter: Payer: Self-pay | Admitting: Certified Nurse Midwife

## 2019-05-21 ENCOUNTER — Other Ambulatory Visit: Payer: Self-pay

## 2019-05-21 MED ORDER — NORETHIN-ETH ESTRAD-FE BIPHAS 1 MG-10 MCG / 10 MCG PO TABS: 1.0000 | ORAL_TABLET | Freq: Every day | ORAL | 3 refills | Status: DC

## 2019-09-18 DIAGNOSIS — L853 Xerosis cutis: Secondary | ICD-10-CM | POA: Diagnosis not present

## 2019-09-18 DIAGNOSIS — L249 Irritant contact dermatitis, unspecified cause: Secondary | ICD-10-CM | POA: Diagnosis not present

## 2020-04-05 ENCOUNTER — Other Ambulatory Visit: Payer: Self-pay | Admitting: Certified Nurse Midwife

## 2020-04-28 ENCOUNTER — Other Ambulatory Visit: Payer: Self-pay | Admitting: Certified Nurse Midwife

## 2020-05-01 ENCOUNTER — Other Ambulatory Visit: Payer: Self-pay

## 2020-05-01 ENCOUNTER — Other Ambulatory Visit (HOSPITAL_COMMUNITY)
Admission: RE | Admit: 2020-05-01 | Discharge: 2020-05-01 | Disposition: A | Discharge status: Home / Self Care | Payer: BC Managed Care – PPO | Source: Ambulatory Visit | Attending: Certified Nurse Midwife | Admitting: Certified Nurse Midwife

## 2020-05-01 ENCOUNTER — Ambulatory Visit (INDEPENDENT_AMBULATORY_CARE_PROVIDER_SITE_OTHER): Payer: BC Managed Care – PPO | Admitting: Certified Nurse Midwife

## 2020-05-01 ENCOUNTER — Encounter: Payer: Self-pay | Admitting: Certified Nurse Midwife

## 2020-05-01 VITALS — BP 118/84 | HR 79 | Ht 67.0 in | Wt 183.6 lb

## 2020-05-01 DIAGNOSIS — Z8742 Personal history of other diseases of the female genital tract: Secondary | ICD-10-CM

## 2020-05-01 DIAGNOSIS — Z1231 Encounter for screening mammogram for malignant neoplasm of breast: Secondary | ICD-10-CM | POA: Diagnosis not present

## 2020-05-01 DIAGNOSIS — Z789 Other specified health status: Secondary | ICD-10-CM

## 2020-05-01 DIAGNOSIS — Z6828 Body mass index (BMI) 28.0-28.9, adult: Secondary | ICD-10-CM

## 2020-05-01 DIAGNOSIS — Z01419 Encounter for gynecological examination (general) (routine) without abnormal findings: Secondary | ICD-10-CM

## 2020-05-01 DIAGNOSIS — Z124 Encounter for screening for malignant neoplasm of cervix: Secondary | ICD-10-CM | POA: Diagnosis not present

## 2020-05-01 MED ORDER — LO LOESTRIN FE 1 MG-10 MCG / 10 MCG PO TABS: 1.0000 | ORAL_TABLET | Freq: Every day | ORAL | 3 refills | Status: DC

## 2020-05-01 NOTE — Progress Notes (Signed)
ANNUAL PREVENTATIVE CARE GYN  ENCOUNTER NOTE  Subjective:       Virgia Kelner Top is a 39 y.o. [id number] female here for a routine annual gynecologic exam.  Current complaints: 1. Desires OCP refill, questions hormone changes since turning 39 2. Needs Pap smear  Denies difficulty breathing or respiratory distress, chest pain, abdominal pain, excessive vaginal bleeding, dysuria, and leg pain.    Declined flu vaccine. Not considering COVID vaccines.    Gynecologic History  Patient's last menstrual period was 03/15/2020 (exact date). Period Cycle (Days): 28 Period Duration (Days): 1-2 Period Pattern: Regular Menstrual Flow: Light Menstrual Control: Panty liner Menstrual Control Change Freq (Hours): 2-4 Dysmenorrhea: (!) Mild (sometimes) Dysmenorrhea Symptoms: Cramping, Throbbing, Nausea, Diarrhea, Headache  Contraception: OCP (estrogen/progesterone)   Last Pap: 04/2017. Results were: Neg/Neg  Last mammogram: due.   Obstetric History  OB History  Gravida Para Term Preterm AB Living  1 1 1     1   SAB TAB Ectopic Multiple Live Births          1    # Outcome Date GA Lbr Len/2nd Weight Sex Delivery Anes PTL Lv  1 Term 11/12/09   7 lb 12 oz (3.515 kg) M CS-LTranv EPI N LIV     Complications: Fetal Intolerance    Past Medical History:  Diagnosis Date  . No pertinent past medical history     Past Surgical History:  Procedure Laterality Date  . CESAREAN SECTION    . CHOLECYSTECTOMY    . EYE SURGERY      Current Outpatient Medications on File Prior to Visit  Medication Sig Dispense Refill  . LO LOESTRIN FE 1 MG-10 MCG / 10 MCG tablet TAKE 1 TABLET BY MOUTH EVERY DAY 28 tablet 0  . Magnesium 100 MG CAPS Take 300 mg by mouth daily.    . Multiple Vitamin (MULTIVITAMIN) capsule Take by mouth.     No current facility-administered medications on file prior to visit.    No Known Allergies  Social History   Socioeconomic History  . Marital status: Single    Spouse  name: Not on file  . Number of children: Not on file  . Years of education: Not on file  . Highest education level: Not on file  Occupational History  . Not on file  Tobacco Use  . Smoking status: Never Smoker  . Smokeless tobacco: Never Used  Vaping Use  . Vaping Use: Never used  Substance and Sexual Activity  . Alcohol use: Yes    Comment: rare  . Drug use: Never  . Sexual activity: Not Currently    Birth control/protection: Pill  Other Topics Concern  . Not on file  Social History Narrative  . Not on file   Social Determinants of Health   Financial Resource Strain:   . Difficulty of Paying Living Expenses: Not on file  Food Insecurity:   . Worried About 01/12/10 in the Last Year: Not on file  . Ran Out of Food in the Last Year: Not on file  Transportation Needs:   . Lack of Transportation (Medical): Not on file  . Lack of Transportation (Non-Medical): Not on file  Physical Activity:   . Days of Exercise per Week: Not on file  . Minutes of Exercise per Session: Not on file  Stress:   . Feeling of Stress : Not on file  Social Connections:   . Frequency of Communication with Friends and Family: Not on file  .  Frequency of Social Gatherings with Friends and Family: Not on file  . Attends Religious Services: Not on file  . Active Member of Clubs or Organizations: Not on file  . Attends Banker Meetings: Not on file  . Marital Status: Not on file  Intimate Partner Violence:   . Fear of Current or Ex-Partner: Not on file  . Emotionally Abused: Not on file  . Physically Abused: Not on file  . Sexually Abused: Not on file    Family History  Problem Relation Age of Onset  . Hypertension Father   . Breast cancer Neg Hx   . Ovarian cancer Neg Hx   . Colon cancer Neg Hx     The following portions of the patient's history were reviewed and updated as appropriate: allergies, current medications, past family history, past medical history, past  social history, past surgical history and problem list.  Review of Systems  ROS negative except as noted above. Information obtained from patient.    Objective:   BP 118/84   Pulse 79   Ht 5\' 7"  (1.702 m)   Wt 183 lb 9.6 oz (83.3 kg)   LMP 03/15/2020 (Exact Date)   BMI 28.76 kg/m    CONSTITUTIONAL: Well-developed, well-nourished female in no acute distress.   PSYCHIATRIC: Normal mood and affect. Normal behavior. Normal judgment and thought content.  NEUROLGIC: Alert and oriented to person, place, and time. Normal muscle tone coordination. No cranial nerve deficit noted.  HENT:  Normocephalic, atraumatic, External right and left ear normal.   EYES: Conjunctivae and EOM are normal. Pupils are equal and round.   NECK: Normal range of motion, supple, no masses.  Normal thyroid.   SKIN: Skin is warm and dry. No rash noted. Not diaphoretic. No erythema. No pallor.  CARDIOVASCULAR: Normal heart rate noted, regular rhythm, no murmur.  RESPIRATORY: Clear to auscultation bilaterally. Effort and breath sounds normal, no problems with respiration noted.  BREASTS: Symmetric in size. No masses, skin changes, nipple drainage, or lymphadenopathy.  ABDOMEN: Soft, normal bowel sounds, no distention noted.  No tenderness, rebound or guarding.   PELVIC:  External Genitalia: Normal  Vagina: Normal  Cervix: Normal, Pap collected  Uterus: Normal  Adnexa: Normal  MUSCULOSKELETAL: Normal range of motion. No tenderness.  No cyanosis, clubbing, or edema.  2+ distal pulses.  LYMPHATIC: No Axillary, Supraclavicular, or Inguinal Adenopathy.  Assessment:   Annual gynecologic examination 39 y.o.   Contraception: OCP (estrogen/progesterone), Lo Loestin   Overweight   Problem List Items Addressed This Visit    None    Visit Diagnoses    Well woman exam    -  Primary   Relevant Orders   Cytology - PAP   MM 3D SCREEN BREAST BILATERAL   Screening for cervical cancer       Relevant Orders    Cytology - PAP   History of abnormal cervical Pap smear       Relevant Orders   Cytology - PAP   Screening mammogram for breast cancer       Relevant Orders   MM 3D SCREEN BREAST BILATERAL   BMI 28.0-28.9,adult          Plan:   Pap: Pap Co Test  Mammogram: Ordered  Labs: Declined  Referral to Medical Weight Management, see orders  Routine preventative health maintenance measures emphasized: Exercise/Diet/Weight control, Tobacco Warnings, Alcohol/Substance use risks and Stress Management; see AVS   Rx OCP, see orders  Reviewed flag symptoms and when  to call  Return to Clinic - 1 Year for [facility] or sooner if needed   Serafina Royals, CNM  [facility], [facility] 05/01/20 8:38 AM

## 2020-05-01 NOTE — Patient Instructions (Signed)
Ethinyl Estradiol; Norethindrone Acetate; Ferrous fumarate tablets or capsules What is this medicine? ETHINYL ESTRADIOL; NORETHINDRONE ACETATE; FERROUS FUMARATE (ETH in il es tra DYE ole; nor eth IN drone AS e tate; FER us FUE ma rate) is an oral contraceptive. The products combine two types of female hormones, an estrogen and a progestin. They are used to prevent ovulation and pregnancy. Some products are also used to treat acne in females. This medicine may be used for other purposes; ask your health care provider or pharmacist if you have questions. COMMON BRAND NAME(S): Aurovela 24 Fe 1/20, Aurovela Fe, Blisovi 24 Fe, Blisovi Fe, Estrostep Fe, Gildess 24 Fe, Gildess Fe 1.5/30, Gildess Fe 1/20, Hailey 24 Fe, Hailey Fe 1.5/30, Junel Fe 1.5/30, Junel Fe 1/20, Junel Fe 24, Larin Fe, Lo Loestrin Fe, Loestrin 24 Fe, Loestrin FE 1.5/30, Loestrin FE 1/20, Lomedia 24 Fe, Microgestin 24 Fe, Microgestin Fe 1.5/30, Microgestin Fe 1/20, Tarina 24 Fe, Tarina Fe 1/20, Taytulla, Tilia Fe, Tri-Legest Fe What should I tell my health care provider before I take this medicine? They need to know if you have any of these conditions:  abnormal vaginal bleeding  blood vessel disease  breast, cervical, endometrial, ovarian, liver, or uterine cancer  diabetes  gallbladder disease  heart disease or recent heart attack  high blood pressure  high cholesterol  history of blood clots  kidney disease  liver disease  migraine headaches  smoke tobacco  stroke  systemic lupus erythematosus (SLE)  an unusual or allergic reaction to estrogens, progestins, other medicines, foods, dyes, or preservatives  pregnant or trying to get pregnant  breast-feeding How should I use this medicine? Take this medicine by mouth. To reduce nausea, this medicine may be taken with food. Follow the directions on the prescription label. Take this medicine at the same time each day and in the order directed on the package. Do  not take your medicine more often than directed. A patient package insert for the product will be given with each prescription and refill. Read this sheet carefully each time. The sheet may change frequently. Contact your pediatrician regarding the use of this medicine in children. Special care may be needed. This medicine has been used in female children who have started having menstrual periods. Overdosage: If you think you have taken too much of this medicine contact a poison control center or emergency room at once. NOTE: This medicine is only for you. Do not share this medicine with others. What if I miss a dose? If you miss a dose, refer to the patient information sheet you received with your medicine for direction. If you miss more than one pill, this medicine may not be as effective and you may need to use another form of birth control. What may interact with this medicine? Do not take this medicine with the following medication:  dasabuvir; ombitasvir; paritaprevir; ritonavir  ombitasvir; paritaprevir; ritonavir This medicine may also interact with the following medications:  acetaminophen  antibiotics or medicines for infections, especially rifampin, rifabutin, rifapentine, and griseofulvin, and possibly penicillins or tetracyclines  aprepitant  ascorbic acid (vitamin C)  atorvastatin  barbiturate medicines, such as phenobarbital  bosentan  carbamazepine  caffeine  clofibrate  cyclosporine  dantrolene  doxercalciferol  felbamate  grapefruit juice  hydrocortisone  medicines for anxiety or sleeping problems, such as diazepam or temazepam  medicines for diabetes, including pioglitazone  mineral oil  modafinil  mycophenolate  nefazodone  oxcarbazepine  phenytoin  prednisolone  ritonavir or other medicines for   HIV infection or AIDS  rosuvastatin  selegiline  soy isoflavones supplements  St. John's wort  tamoxifen or  raloxifene  theophylline  thyroid hormones  topiramate  warfarin This list may not describe all possible interactions. Give your health care provider a list of all the medicines, herbs, non-prescription drugs, or dietary supplements you use. Also tell them if you smoke, drink alcohol, or use illegal drugs. Some items may interact with your medicine. What should I watch for while using this medicine? Visit your doctor or health care professional for regular checks on your progress. You will need a regular breast and pelvic exam and Pap smear while on this medicine. Use an additional method of contraception during the first cycle that you take these tablets. If you have any reason to think you are pregnant, stop taking this medicine right away and contact your doctor or health care professional. If you are taking this medicine for hormone related problems, it may take several cycles of use to see improvement in your condition. Smoking increases the risk of getting a blood clot or having a stroke while you are taking birth control pills, especially if you are more than 40 years old. You are strongly advised not to smoke. This medicine can make your body retain fluid, making your fingers, hands, or ankles swell. Your blood pressure can go up. Contact your doctor or health care professional if you feel you are retaining fluid. This medicine can make you more sensitive to the sun. Keep out of the sun. If you cannot avoid being in the sun, wear protective clothing and use sunscreen. Do not use sun lamps or tanning beds/booths. If you wear contact lenses and notice visual changes, or if the lenses begin to feel uncomfortable, consult your eye care specialist. In some women, tenderness, swelling, or minor bleeding of the gums may occur. Notify your dentist if this happens. Brushing and flossing your teeth regularly may help limit this. See your dentist regularly and inform your dentist of the medicines you  are taking. If you are going to have elective surgery, you may need to stop taking this medicine before the surgery. Consult your health care professional for advice. This medicine does not protect you against HIV infection (AIDS) or any other sexually transmitted diseases. What side effects may I notice from receiving this medicine? Side effects that you should report to your doctor or health care professional as soon as possible:  allergic reactions like skin rash, itching or hives, swelling of the face, lips, or tongue  breast tissue changes or discharge  changes in vaginal bleeding during your period or between your periods  changes in vision  chest pain  confusion  coughing up blood  dizziness  feeling faint or lightheaded  headaches or migraines  leg, arm or groin pain  loss of balance or coordination  severe or sudden headaches  stomach pain (severe)  sudden shortness of breath  sudden numbness or weakness of the face, arm or leg  symptoms of vaginal infection like itching, irritation or unusual discharge  tenderness in the upper abdomen  trouble speaking or understanding  vomiting  yellowing of the eyes or skin Side effects that usually do not require medical attention (report to your doctor or health care professional if they continue or are bothersome):  breakthrough bleeding and spotting that continues beyond the 3 initial cycles of pills  breast tenderness  mood changes, anxiety, depression, frustration, anger, or emotional outbursts  increased sensitivity to sun   or ultraviolet light  nausea  skin rash, acne, or brown spots on the skin  weight gain (slight) This list may not describe all possible side effects. Call your doctor for medical advice about side effects. You may report side effects to FDA at 1-800-FDA-1088. Where should I keep my medicine? Keep out of the reach of children. Store at room temperature between 15 and 30 degrees C  (59 and 86 degrees F). Throw away any unused medicine after the expiration date. NOTE: This sheet is a summary. It may not cover all possible information. If you have questions about this medicine, talk to your doctor, pharmacist, or health care provider.  2020 Elsevier/Gold Standard (2016-03-08 08:04:41)   Preventive Care 22-48 Years Old, Female Preventive care refers to visits with your health care provider and lifestyle choices that can promote health and wellness. This includes:  A yearly physical exam. This may also be called an annual well check.  Regular dental visits and eye exams.  Immunizations.  Screening for certain conditions.  Healthy lifestyle choices, such as eating a healthy diet, getting regular exercise, not using drugs or products that contain nicotine and tobacco, and limiting alcohol use. What can I expect for my preventive care visit? Physical exam Your health care provider will check your:  Height and weight. This may be used to calculate body mass index (BMI), which tells if you are at a healthy weight.  Heart rate and blood pressure.  Skin for abnormal spots. Counseling Your health care provider may ask you questions about your:  Alcohol, tobacco, and drug use.  Emotional well-being.  Home and relationship well-being.  Sexual activity.  Eating habits.  Work and work Statistician.  Method of birth control.  Menstrual cycle.  Pregnancy history. What immunizations do I need?  Influenza (flu) vaccine  This is recommended every year. Tetanus, diphtheria, and pertussis (Tdap) vaccine  You may need a Td booster every 10 years. Varicella (chickenpox) vaccine  You may need this if you have not been vaccinated. Zoster (shingles) vaccine  You may need this after age 61. Measles, mumps, and rubella (MMR) vaccine  You may need at least one dose of MMR if you were born in 1957 or later. You may also need a second dose. Pneumococcal conjugate  (PCV13) vaccine  You may need this if you have certain conditions and were not previously vaccinated. Pneumococcal polysaccharide (PPSV23) vaccine  You may need one or two doses if you smoke cigarettes or if you have certain conditions. Meningococcal conjugate (MenACWY) vaccine  You may need this if you have certain conditions. Hepatitis A vaccine  You may need this if you have certain conditions or if you travel or work in places where you may be exposed to hepatitis A. Hepatitis B vaccine  You may need this if you have certain conditions or if you travel or work in places where you may be exposed to hepatitis B. Haemophilus influenzae type b (Hib) vaccine  You may need this if you have certain conditions. Human papillomavirus (HPV) vaccine  If recommended by your health care provider, you may need three doses over 6 months. You may receive vaccines as individual doses or as more than one vaccine together in one shot (combination vaccines). Talk with your health care provider about the risks and benefits of combination vaccines. What tests do I need? Blood tests  Lipid and cholesterol levels. These may be checked every 5 years, or more frequently if you are over 50 years  old.  Hepatitis C test.  Hepatitis B test. Screening  Lung cancer screening. You may have this screening every year starting at age 87 if you have a 30-pack-year history of smoking and currently smoke or have quit within the past 15 years.  Colorectal cancer screening. All adults should have this screening starting at age 43 and continuing until age 73. Your health care provider may recommend screening at age 10 if you are at increased risk. You will have tests every 1-10 years, depending on your results and the type of screening test.  Diabetes screening. This is done by checking your blood sugar (glucose) after you have not eaten for a while (fasting). You may have this done every 1-3 years.  Mammogram. This  may be done every 1-2 years. Talk with your health care provider about when you should start having regular mammograms. This may depend on whether you have a family history of breast cancer.  BRCA-related cancer screening. This may be done if you have a family history of breast, ovarian, tubal, or peritoneal cancers.  Pelvic exam and Pap test. This may be done every 3 years starting at age 25. Starting at age 29, this may be done every 5 years if you have a Pap test in combination with an HPV test. Other tests  Sexually transmitted disease (STD) testing.  Bone density scan. This is done to screen for osteoporosis. You may have this scan if you are at high risk for osteoporosis. Follow these instructions at home: Eating and drinking  Eat a diet that includes fresh fruits and vegetables, whole grains, lean protein, and low-fat dairy.  Take vitamin and mineral supplements as recommended by your health care provider.  Do not drink alcohol if: ? Your health care provider tells you not to drink. ? You are pregnant, may be pregnant, or are planning to become pregnant.  If you drink alcohol: ? Limit how much you have to 0-1 drink a day. ? Be aware of how much alcohol is in your drink. In the [place] one drink equals one 12 oz bottle of beer (355 mL), one 5 oz glass of wine (148 mL), or one 1 oz glass of hard liquor (44 mL). Lifestyle  Take daily care of your teeth and gums.  Stay active. Exercise for at least 30 minutes on 5 or more days each week.  Do not use any products that contain nicotine or tobacco, such as cigarettes, e-cigarettes, and chewing tobacco. If you need help quitting, ask your health care provider.  If you are sexually active, practice safe sex. Use a condom or other form of birth control (contraception) in order to prevent pregnancy and STIs (sexually transmitted infections).  If told by your health care provider, take low-dose aspirin daily starting at age 21. What's  next?  Visit your health care provider once a year for a well check visit.  Ask your health care provider how often you should have your eyes and teeth checked.  Stay up to date on all vaccines. This information is not intended to replace advice given to you by your health care provider. Make sure you discuss any questions you have with your health care provider. Document Revised: 03/09/2018 Document Reviewed: 03/09/2018 Elsevier Patient Education  2020 [employer].

## 2020-05-06 LAB — CYTOLOGY - PAP
Comment: NEGATIVE
Comment: NEGATIVE
Diagnosis: NEGATIVE
HPV 16: POSITIVE — AB
HPV 18 / 45: NEGATIVE
High risk HPV: POSITIVE — AB

## 2021-05-07 ENCOUNTER — Encounter: Payer: BC Managed Care – PPO | Admitting: Certified Nurse Midwife

## 2021-05-12 ENCOUNTER — Encounter: Payer: BC Managed Care – PPO | Admitting: Certified Nurse Midwife

## 2021-06-24 ENCOUNTER — Ambulatory Visit (INDEPENDENT_AMBULATORY_CARE_PROVIDER_SITE_OTHER): Payer: BC Managed Care – PPO | Admitting: Obstetrics and Gynecology

## 2021-06-24 ENCOUNTER — Encounter: Payer: Self-pay | Admitting: Obstetrics and Gynecology

## 2021-06-24 ENCOUNTER — Other Ambulatory Visit: Payer: Self-pay

## 2021-06-24 ENCOUNTER — Other Ambulatory Visit (HOSPITAL_COMMUNITY)
Admission: RE | Admit: 2021-06-24 | Discharge: 2021-06-24 | Disposition: A | Discharge status: Home / Self Care | Payer: BC Managed Care – PPO | Source: Ambulatory Visit | Attending: Certified Nurse Midwife | Admitting: Certified Nurse Midwife

## 2021-06-24 VITALS — BP 121/85 | HR 82 | Ht 67.0 in | Wt 192.0 lb

## 2021-06-24 DIAGNOSIS — B977 Papillomavirus as the cause of diseases classified elsewhere: Secondary | ICD-10-CM

## 2021-06-24 DIAGNOSIS — Z01419 Encounter for gynecological examination (general) (routine) without abnormal findings: Secondary | ICD-10-CM | POA: Diagnosis not present

## 2021-06-24 MED ORDER — LO LOESTRIN FE 1 MG-10 MCG / 10 MCG PO TABS: 1.0000 | ORAL_TABLET | Freq: Every day | ORAL | 3 refills | Status: DC

## 2021-06-24 NOTE — Progress Notes (Signed)
HPI:      Heather Mcgee is a 40 y.o. G1P1001 who LMP was Patient's last menstrual period was 06/06/2021 (exact date).  Subjective:   She presents today for her annual examination.  She is taking OCPs and having regular cycles.  She is not missing pills.  Her bleeding is very light.  She is obviously happy with this method and would like to continue. She has not yet had a mammogram and she is willing to get one. She is fasting today for blood work. She has a recent history of positive HPV but negative cytology on Pap.    Hx: The following portions of the patient's history were reviewed and updated as appropriate:             She  has a past medical history of No pertinent past medical history. She does not have any pertinent problems on file. She  has a past surgical history that includes Cesarean section; Cholecystectomy; and Eye surgery. Her family history includes Hypertension in her father. She  reports that she has never smoked. She has never used smokeless tobacco. She reports current alcohol use. She reports that she does not use drugs. She has a current medication list which includes the following prescription(s): albuterol, ergocalciferol, ipratropium, magnesium, mounjaro, multivitamin, and lo loestrin fe. She has No Known Allergies.       Review of Systems:  Review of Systems  Constitutional: Denied constitutional symptoms, night sweats, recent illness, fatigue, fever, insomnia and weight loss.  Eyes: Denied eye symptoms, eye pain, photophobia, vision change and visual disturbance.  Ears/Nose/Throat/Neck: Denied ear, nose, throat or neck symptoms, hearing loss, nasal discharge, sinus congestion and sore throat.  Cardiovascular: Denied cardiovascular symptoms, arrhythmia, chest pain/pressure, edema, exercise intolerance, orthopnea and palpitations.  Respiratory: Denied pulmonary symptoms, asthma, pleuritic pain, productive sputum, cough, dyspnea and wheezing.   Gastrointestinal: Denied, gastro-esophageal reflux, melena, nausea and vomiting.  Genitourinary: Denied genitourinary symptoms including symptomatic vaginal discharge, pelvic relaxation issues, and urinary complaints.  Musculoskeletal: Denied musculoskeletal symptoms, stiffness, swelling, muscle weakness and myalgia.  Dermatologic: Denied dermatology symptoms, rash and scar.  Neurologic: Denied neurology symptoms, dizziness, headache, neck pain and syncope.  Psychiatric: Denied psychiatric symptoms, anxiety and depression.  Endocrine: Denied endocrine symptoms including hot flashes and night sweats.   Meds:   Current Outpatient Medications on File Prior to Visit  Medication Sig Dispense Refill   albuterol (VENTOLIN HFA) 108 (90 Base) MCG/ACT inhaler Inhale 2 puffs into the lungs every 4 (four) hours as needed.     ergocalciferol (VITAMIN D2) 1.25 MG (50000 UT) capsule Take 1 capsule by mouth once a week.     ipratropium (ATROVENT) 0.06 % nasal spray Place 2 sprays into both nostrils 4 (four) times daily.     Magnesium 100 MG CAPS Take 300 mg by mouth daily.     MOUNJARO 2.5 MG/0.5ML Pen SMARTSIG:2.5 Milligram(s) SUB-Q Once a Week     Multiple Vitamin (MULTIVITAMIN) capsule Take by mouth.     No current facility-administered medications on file prior to visit.     Objective:     Vitals:   06/24/21 1105  BP: 121/85  Pulse: 82    Filed Weights   06/24/21 1105  Weight: 192 lb (87.1 kg)              Physical examination General NAD, Conversant  HEENT Atraumatic; Op clear with mmm.  Normo-cephalic. Pupils reactive. Anicteric sclerae  Thyroid/Neck Smooth without nodularity or enlargement. Normal ROM.  Neck Supple.  Skin No rashes, lesions or ulceration. Normal palpated skin turgor. No nodularity.  Breasts: No masses or discharge.  Symmetric.  No axillary adenopathy.  Lungs: Clear to auscultation.No rales or wheezes. Normal Respiratory effort, no retractions.  Heart: NSR.  No  murmurs or rubs appreciated. No periferal edema  Abdomen: Soft.  Non-tender.  No masses.  No HSM. No hernia  Extremities: Moves all appropriately.  Normal ROM for age. No lymphadenopathy.  Neuro: Oriented to PPT.  Normal mood. Normal affect.     Pelvic:   Vulva: Normal appearance.  No lesions.  Vagina: No lesions or abnormalities noted.  Support: Normal pelvic support.  Urethra No masses tenderness or scarring.  Meatus Normal size without lesions or prolapse.  Cervix: Normal appearance.  No lesions.  Anus: Normal exam.  No lesions.  Perineum: Normal exam.  No lesions.        Bimanual   Uterus: Normal size.  Non-tender.  Mobile.  AV.  Adnexae: No masses.  Non-tender to palpation.  Cul-de-sac: Negative for abnormality.     Assessment:    G1P1001 There are no problems to display for this patient.    1. Well woman exam with routine gynecological exam   2. HPV in female        Plan:            1.  Basic Screening Recommendations The basic screening recommendations for asymptomatic women were discussed with the patient during her visit.  The age-appropriate recommendations were discussed with her and the rational for the tests reviewed.  When I am informed by the patient that another primary care physician has previously obtained the age-appropriate tests and they are up-to-date, only outstanding tests are ordered and referrals given as necessary.  Abnormal results of tests will be discussed with her when all of her results are completed.  Routine preventative health maintenance measures emphasized: Exercise/Diet/Weight control, Tobacco Warnings, Alcohol/Substance use risks and Stress Management Pap with HPV typing ordered -blood work today -mammogram ordered 2.  Continue OCPs Orders Orders Placed This Encounter  Procedures   MM DIGITAL SCREENING BILATERAL   Basic metabolic panel   CBC   Hemoglobin A1c   Lipid panel   TSH     Meds ordered this encounter  Medications    Norethindrone-Ethinyl Estradiol-Fe Biphas (LO LOESTRIN FE) 1 MG-10 MCG / 10 MCG tablet    Sig: Take 1 tablet by mouth daily.    Dispense:  84 tablet    Refill:  3          F/U  Return in about 1 year (around 06/24/2022) for Annual Physical.  Elonda Husky, M.D. 06/24/2021 11:45 AM

## 2021-06-25 LAB — BASIC METABOLIC PANEL
BUN/Creatinine Ratio: 20 (ref 9–23)
BUN: 17 mg/dL (ref 6–24)
CO2: 21 mmol/L (ref 20–29)
Calcium: 9.5 mg/dL (ref 8.7–10.2)
Chloride: 100 mmol/L (ref 96–106)
Creatinine, Ser: 0.87 mg/dL (ref 0.57–1.00)
Glucose: 79 mg/dL (ref 70–99)
Potassium: 4.4 mmol/L (ref 3.5–5.2)
Sodium: 138 mmol/L (ref 134–144)
eGFR: 86 mL/min/{1.73_m2} (ref >59)

## 2021-06-25 LAB — LIPID PANEL
Chol/HDL Ratio: 3.1 ratio (ref 0.0–4.4)
Cholesterol, Total: 167 mg/dL (ref 100–199)
HDL: 54 mg/dL (ref >39)
LDL Chol Calc (NIH): 94 mg/dL (ref 0–99)
Triglycerides: 105 mg/dL (ref 0–149)
VLDL Cholesterol Cal: 19 mg/dL (ref 5–40)

## 2021-06-25 LAB — HEMOGLOBIN A1C
Est. average glucose Bld gHb Est-mCnc: 105 mg/dL
Hgb A1c MFr Bld: 5.3 % (ref 4.8–5.6)

## 2021-06-25 LAB — CBC
Hematocrit: 41.6 % (ref 34.0–46.6)
Hemoglobin: 14.2 g/dL (ref 11.1–15.9)
MCH: 31.3 pg (ref 26.6–33.0)
MCHC: 34.1 g/dL (ref 31.5–35.7)
MCV: 92 fL (ref 79–97)
Platelets: 368 10*3/uL (ref 150–450)
RBC: 4.54 x10E6/uL (ref 3.77–5.28)
RDW: 12.2 % (ref 11.7–15.4)
WBC: 7.8 10*3/uL (ref 3.4–10.8)

## 2021-06-25 LAB — TSH: TSH: 0.782 u[IU]/mL (ref 0.450–4.500)

## 2021-06-26 ENCOUNTER — Encounter: Payer: Self-pay | Admitting: Obstetrics and Gynecology

## 2021-06-29 LAB — CYTOLOGY - PAP
Comment: NEGATIVE
Comment: NEGATIVE
HPV 16: POSITIVE — AB
HPV 18 / 45: NEGATIVE
High risk HPV: POSITIVE — AB

## 2021-06-29 NOTE — Progress Notes (Signed)
Abnormal PAP - please schedule her for a colposcopy.

## 2021-08-06 ENCOUNTER — Other Ambulatory Visit: Payer: Self-pay

## 2021-08-06 ENCOUNTER — Other Ambulatory Visit (HOSPITAL_COMMUNITY)
Admission: RE | Admit: 2021-08-06 | Discharge: 2021-08-06 | Disposition: A | Discharge status: Home / Self Care | Payer: BC Managed Care – PPO | Source: Ambulatory Visit | Attending: Obstetrics and Gynecology | Admitting: Obstetrics and Gynecology

## 2021-08-06 ENCOUNTER — Encounter: Payer: Self-pay | Admitting: Obstetrics and Gynecology

## 2021-08-06 ENCOUNTER — Ambulatory Visit (INDEPENDENT_AMBULATORY_CARE_PROVIDER_SITE_OTHER): Payer: BC Managed Care – PPO | Admitting: Obstetrics and Gynecology

## 2021-08-06 VITALS — BP 117/76 | HR 76 | Ht 67.0 in | Wt 186.0 lb

## 2021-08-06 DIAGNOSIS — R87612 Low grade squamous intraepithelial lesion on cytologic smear of cervix (LGSIL): Secondary | ICD-10-CM

## 2021-08-06 DIAGNOSIS — B977 Papillomavirus as the cause of diseases classified elsewhere: Secondary | ICD-10-CM | POA: Diagnosis not present

## 2021-08-06 DIAGNOSIS — N72 Inflammatory disease of cervix uteri: Secondary | ICD-10-CM | POA: Diagnosis not present

## 2021-08-06 NOTE — Progress Notes (Signed)
° °  HPI:  Heather Mcgee is a 41 y.o.  G1P1001  who presents today for evaluation and management of abnormal cervical cytology.    Dysplasia History: 2 years ago she was found to have positive HPV but negative cellularity. This year LGSIL with positive HPV type 16 present  ROS:  Pertinent items noted in HPI and remainder of comprehensive ROS otherwise negative.  OB History  Gravida Para Term Preterm AB Living  1 1 1     1   SAB IAB Ectopic Multiple Live Births          1    # Outcome Date GA Lbr Len/2nd Weight Sex Delivery Anes PTL Lv  1 Term 11/12/09   7 lb 12 oz (3.515 kg) M CS-LTranv EPI N LIV     Complications: Fetal Intolerance    Past Medical History:  Diagnosis Date   No pertinent past medical history     Past Surgical History:  Procedure Laterality Date   CESAREAN SECTION     CHOLECYSTECTOMY     EYE SURGERY      SOCIAL HISTORY:  Social History   Substance and Sexual Activity  Alcohol Use Yes   Comment: rare    Social History   Substance and Sexual Activity  Drug Use Never     Family History  Problem Relation Age of Onset   Hypertension Father    Breast cancer Neg Hx    Ovarian cancer Neg Hx    Colon cancer Neg Hx     ALLERGIES:  Patient has no known allergies.  She has a current medication list which includes the following prescription(s): albuterol, ergocalciferol, ipratropium, magnesium, mounjaro, multivitamin, and lo loestrin fe.  Physical Exam: -Vitals:  BP 117/76    Pulse 76    Ht 5\' 7"  (1.702 m)    Wt 186 lb (84.4 kg)    LMP 07/28/2021    SpO2 100%    BMI 29.13 kg/m   PROCEDURE: Colposcopy performed with 4% acetic acid after verbal consent obtained                           -Aceto-white Lesions Location(s): 11 and 2 o'clock.              -Biopsy performed at 11 and 2 o'clock               -ECC indicated and performed: No.     -Biopsy sites made hemostatic with pressure and Monsel's solution   -Satisfactory colposcopy: Yes.       -Evidence of Invasive cervical CA :  NO  ASSESSMENT:  Heather Mcgee is a 41 y.o. G1P1001 here for  1. Low grade squamous intraepithelial lesion on cytologic smear of cervix (LGSIL)   2. High risk human papilloma virus (HPV) infection of cervix   .  PLAN: 1.  I discussed the grading system of pap smears and HPV high risk viral types.  We will discuss management after colpo results return.  HPV and its relationship to cytology discussed in detail.  The importance of type 16 discussed.  No orders of the defined types were placed in this encounter.          F/U  Return in about 2 weeks (around 08/20/2021) for Colpo f/u.  45 ,MD 08/06/2021,11:43 AM

## 2021-08-11 LAB — SURGICAL PATHOLOGY

## 2021-08-12 NOTE — Progress Notes (Signed)
Heather Mcgee: Please schedule a time to meet with me to discuss these colposcopy results.  We can make plans going forward and I want to make sure that we are on the same page regarding future Pap smears colposcopies or possible treatment options.  If you would like we can do this as a video visit.

## 2021-08-19 ENCOUNTER — Other Ambulatory Visit: Payer: Self-pay

## 2021-08-19 ENCOUNTER — Encounter: Payer: Self-pay | Admitting: Obstetrics and Gynecology

## 2021-08-19 ENCOUNTER — Telehealth (INDEPENDENT_AMBULATORY_CARE_PROVIDER_SITE_OTHER): Payer: BC Managed Care – PPO | Admitting: Obstetrics and Gynecology

## 2021-08-19 VITALS — Ht 66.0 in | Wt 180.0 lb

## 2021-08-19 DIAGNOSIS — R87612 Low grade squamous intraepithelial lesion on cytologic smear of cervix (LGSIL): Secondary | ICD-10-CM

## 2021-08-19 DIAGNOSIS — N871 Moderate cervical dysplasia: Secondary | ICD-10-CM

## 2021-08-19 NOTE — Progress Notes (Signed)
Patient presents for virtual visit to discuss colposcopy results. Patient states no concerns or questions at this time.

## 2021-08-19 NOTE — Progress Notes (Signed)
Virtual Visit via Video Note  I connected with Heather Mcgee on 08/19/21 at 10:45 AM EST by video and verified that I was speaking with the correct person using two identifiers.    [name] is a 41 y.o. G1P1001 who LMP was Patient's last menstrual period was 07/28/2021 (exact date). I discussed the limitations, risks, security and privacy concerns of performing an evaluation and management service by video and the availability of in person appointments. I also discussed with the patient that there may be a patient responsible charge related to this service. The patient expressed understanding and agreed to proceed. Visit started as a video visit and had technical difficulties with sound.  Converted visit to televisit.  Location of [place]:  [place]  Patient gave explicit verbal consent for video visit:  YES  Location of provider:  [facility] office  Persons other than physician and patient involved in provider conference:  None   Subjective:   History of Present Illness:    She presents today to discuss her most recent colposcopy for abnormal Pap. Pap approximately a year and a half ago was positive for HPV negative cytology Pap 3 months ago was positive HPV (type 16 present) and LGSIL She then underwent colposcopy.  Hx: The following portions of the patient's history were reviewed and updated as appropriate:             She  has a past medical history of No pertinent past medical history. She does not have any pertinent problems on file. She  has a past surgical history that includes Cesarean section; Cholecystectomy; and Eye surgery. Her family history includes Hypertension in her father. She  reports that she has never smoked. She has never used smokeless tobacco. She reports current alcohol use. She reports that she does not use drugs. She has a current medication list which includes the following prescription(s): magnesium, mounjaro, multivitamin, lo loestrin fe,  albuterol, ergocalciferol, and ipratropium. She has No Known Allergies.       Review of Systems:  Review of Systems  Constitutional: Denied constitutional symptoms, night sweats, recent illness, fatigue, fever, insomnia and weight loss.  Eyes: Denied eye symptoms, eye pain, photophobia, vision change and visual disturbance.  Ears/Nose/Throat/Neck: Denied ear, nose, throat or neck symptoms, hearing loss, nasal discharge, sinus congestion and sore throat.  Cardiovascular: Denied cardiovascular symptoms, arrhythmia, chest pain/pressure, edema, exercise intolerance, orthopnea and palpitations.  Respiratory: Denied pulmonary symptoms, asthma, pleuritic pain, productive sputum, cough, dyspnea and wheezing.  Gastrointestinal: Denied, gastro-esophageal reflux, melena, nausea and vomiting.  Genitourinary: Denied genitourinary symptoms including symptomatic vaginal discharge, pelvic relaxation issues, and urinary complaints.  Musculoskeletal: Denied musculoskeletal symptoms, stiffness, swelling, muscle weakness and myalgia.  Dermatologic: Denied dermatology symptoms, rash and scar.  Neurologic: Denied neurology symptoms, dizziness, headache, neck pain and syncope.  Psychiatric: Denied psychiatric symptoms, anxiety and depression.  Endocrine: Denied endocrine symptoms including hot flashes and night sweats.   Meds:   Current Outpatient Medications on File Prior to Visit  Medication Sig Dispense Refill   Magnesium 100 MG CAPS Take 300 mg by mouth daily.     MOUNJARO 2.5 MG/0.5ML Pen SMARTSIG:2.5 Milligram(s) SUB-Q Once a Week     Multiple Vitamin (MULTIVITAMIN) capsule Take by mouth.     Norethindrone-Ethinyl Estradiol-Fe Biphas (LO LOESTRIN FE) 1 MG-10 MCG / 10 MCG tablet Take 1 tablet by mouth daily. 84 tablet 3   albuterol (VENTOLIN HFA) 108 (90 Base) MCG/ACT inhaler Inhale 2 puffs into the lungs every 4 (  four) hours as needed. (Patient not taking: Reported on 08/19/2021)     ergocalciferol (VITAMIN  D2) 1.25 MG (50000 UT) capsule Take 1 capsule by mouth once a week. (Patient not taking: Reported on 08/19/2021)     ipratropium (ATROVENT) 0.06 % nasal spray Place 2 sprays into both nostrils 4 (four) times daily. (Patient not taking: Reported on 08/19/2021)     No current facility-administered medications on file prior to visit.    Assessment:    G1P1001 There are no problems to display for this patient.    1. Low grade squamous intraepithelial lesion on cytologic smear of cervix (LGSIL)   2. CIN II (cervical intraepithelial neoplasia II)     CIN-2 by colposcopically directed biopsies.  Plan:            1.  Natural course and history of HPV and cervical dysplasia discussed in detail.  CIN-2 specifically discussed.  Relationship of high-grade HPV specifically type 16 was discussed and its aggressive nature reviewed. I have given the patient several choices regarding future management.  These include follow-up colposcopy in 6 months versus excisional biopsy (LEEP).  She has chosen to have a follow-up colposcopy in 6 months.  The idea being that if CIN-2 persists or worsens to CIN-3 LEEP will be performed after that.  If there is evidence of regression to CIN-1 we will continue to follow appropriately. She has chosen follow-up colposcopy in 6 months.  I have asked her to call the office to schedule this. Orders No orders of the defined types were placed in this encounter.   No orders of the defined types were placed in this encounter.    I spent 22 minutes involved in the care of this patient preparing to see the patient by obtaining and reviewing her medical history (including labs, imaging tests and prior procedures), documenting clinical information in the electronic health record (EHR), counseling and coordinating care plans, writing and sending prescriptions, ordering tests or procedures and in direct communicating with the patient and medical staff discussing pertinent items from her  history and physical exam.  Finis Bud, M.D. 08/19/2021 11:57 AM

## 2021-10-09 ENCOUNTER — Other Ambulatory Visit: Payer: Self-pay | Admitting: Obstetrics and Gynecology

## 2021-10-09 DIAGNOSIS — Z1231 Encounter for screening mammogram for malignant neoplasm of breast: Secondary | ICD-10-CM

## 2021-11-06 ENCOUNTER — Encounter: Payer: Self-pay | Admitting: Obstetrics and Gynecology

## 2021-11-10 ENCOUNTER — Telehealth (INDEPENDENT_AMBULATORY_CARE_PROVIDER_SITE_OTHER): Payer: BC Managed Care – PPO | Admitting: Obstetrics and Gynecology

## 2021-11-10 ENCOUNTER — Encounter: Payer: Self-pay | Admitting: Obstetrics and Gynecology

## 2021-11-10 DIAGNOSIS — Z3009 Encounter for other general counseling and advice on contraception: Secondary | ICD-10-CM | POA: Diagnosis not present

## 2021-11-10 MED ORDER — NORETHIN ACE-ETH ESTRAD-FE 1.5-30 MG-MCG PO TABS
1.0000 | ORAL_TABLET | Freq: Every day | ORAL | 1 refills | Status: DC
Start: 2021-11-10 — End: 2022-04-12

## 2021-11-10 NOTE — Progress Notes (Signed)
Virtual Visit via Video Note ? ?I connected with Heather Mcgee on 11/10/21 at  7:45 AM EDT by video and verified that I was speaking with the correct person using two identifiers. ?   Heather Mcgee is a 41 y.o. G1P1001 who LMP was No LMP recorded. ?I discussed the limitations, risks, security and privacy concerns of performing an evaluation and management service by video and the availability of in person appointments. I also discussed with the patient that there may be a patient responsible charge related to this service. The patient expressed understanding and agreed to proceed. ? ?Location of patient:  [place] ? ?Patient gave explicit verbal consent for video visit:  YES ? ?Location of provider:  [facility] office ? ?Persons other than physician and patient involved in provider conference:  None ? ? ?Subjective:  ? ?History of Present Illness:    ?Based on some of her symptoms her primary care doctor has recommended that she change her hormones by changing her birth control pills.  The feeling is that this may change her overall hormone level. ? ?Hx: ?The following portions of the patient's history were reviewed and updated as appropriate: ?            She  has a past medical history of No pertinent past medical history. ?She does not have any pertinent problems on file. ?She  has a past surgical history that includes Cesarean section; Cholecystectomy; and Eye surgery. ?Her family history includes Hypertension in her father. ?She  reports that she has never smoked. She has never used smokeless tobacco. She reports current alcohol use. She reports that she does not use drugs. ?She has a current medication list which includes the following prescription(s): norethindrone-ethinyl estradiol-iron, albuterol, ergocalciferol, ipratropium, magnesium, mounjaro, and multivitamin. ?She has No Known Allergies. ?      ?Review of Systems:  ?Review of Systems ? ?Constitutional: Denied constitutional symptoms, night  sweats, recent illness, fatigue, fever, insomnia and weight loss.  ?Eyes: Denied eye symptoms, eye pain, photophobia, vision change and visual disturbance.  ?Ears/Nose/Throat/Neck: Denied ear, nose, throat or neck symptoms, hearing loss, nasal discharge, sinus congestion and sore throat.  ?Cardiovascular: Denied cardiovascular symptoms, arrhythmia, chest pain/pressure, edema, exercise intolerance, orthopnea and palpitations.  ?Respiratory: Denied pulmonary symptoms, asthma, pleuritic pain, productive sputum, cough, dyspnea and wheezing.  ?Gastrointestinal: Denied, gastro-esophageal reflux, melena, nausea and vomiting.  ?Genitourinary: Denied genitourinary symptoms including symptomatic vaginal discharge, pelvic relaxation issues, and urinary complaints.  ?Musculoskeletal: Denied musculoskeletal symptoms, stiffness, swelling, muscle weakness and myalgia.  ?Dermatologic: Denied dermatology symptoms, rash and scar.  ?Neurologic: Denied neurology symptoms, dizziness, headache, neck pain and syncope.  ?Psychiatric: Denied psychiatric symptoms, anxiety and depression.  ?Endocrine: Denied endocrine symptoms including hot flashes and night sweats.  ? ?Meds: ?  ?Current Outpatient Medications on File Prior to Visit  ?Medication Sig Dispense Refill  ? albuterol (VENTOLIN HFA) 108 (90 Base) MCG/ACT inhaler Inhale 2 puffs into the lungs every 4 (four) hours as needed. (Patient not taking: Reported on 08/19/2021)    ? ergocalciferol (VITAMIN D2) 1.25 MG (50000 UT) capsule Take 1 capsule by mouth once a week. (Patient not taking: Reported on 08/19/2021)    ? ipratropium (ATROVENT) 0.06 % nasal spray Place 2 sprays into both nostrils 4 (four) times daily. (Patient not taking: Reported on 08/19/2021)    ? Magnesium 100 MG CAPS Take 300 mg by mouth daily.    ? MOUNJARO 2.5 MG/0.5ML Pen SMARTSIG:2.5 Milligram(s) SUB-Q Once a Week    ?  Multiple Vitamin (MULTIVITAMIN) capsule Take by mouth.    ? ?No current facility-administered medications  on file prior to visit.  ? ? ?Assessment:  ?  ?G1P1001 ?There are no problems to display for this patient. ? ?  ?1. Birth control counseling   ? ? Patient desires a change in birth control pills. ? ?Plan:  ?  ?       ? 1.  We have discussed multiple forms of birth control pills including progesterone only pills as compared to combination OCPs.  Lonna Mas would like to try a different OCP at a slightly higher dose. ?We have decided upon Loestrin 1.5 30s.  She will discontinue her low Loestrin at the end of this cycle. ?Orders ?No orders of the defined types were placed in this encounter. ? ?  ?Meds ordered this encounter  ?Medications  ? norethindrone-ethinyl estradiol-iron (LOESTRIN FE 1.5/30) 1.5-30 MG-MCG tablet  ?  Sig: Take 1 tablet by mouth at bedtime.  ?  Dispense:  84 tablet  ?  Refill:  1  ?  ?  F/U ? Return in about 4 months (around 03/13/2022). ?I spent 22 minutes involved in the care of this patient preparing to see the patient by obtaining and reviewing her medical history (including labs, imaging tests and prior procedures), documenting clinical information in the electronic health record (EHR), counseling and coordinating care plans, writing and sending prescriptions, ordering tests or procedures and in direct communicating with the patient and medical staff discussing pertinent items from her history and physical exam. ? ? ?Finis Bud, M.D. ?11/10/2021 ?8:11 AM ? ?  ?

## 2021-11-16 ENCOUNTER — Ambulatory Visit
Admission: RE | Admit: 2021-11-16 | Discharge: 2021-11-16 | Disposition: A | Discharge status: Home / Self Care | Payer: BC Managed Care – PPO | Source: Ambulatory Visit | Attending: Obstetrics and Gynecology | Admitting: Obstetrics and Gynecology

## 2021-11-16 DIAGNOSIS — Z1231 Encounter for screening mammogram for malignant neoplasm of breast: Secondary | ICD-10-CM

## 2021-11-17 ENCOUNTER — Other Ambulatory Visit: Payer: Self-pay | Admitting: Obstetrics and Gynecology

## 2021-11-17 DIAGNOSIS — R928 Other abnormal and inconclusive findings on diagnostic imaging of breast: Secondary | ICD-10-CM

## 2021-11-17 DIAGNOSIS — N63 Unspecified lump in unspecified breast: Secondary | ICD-10-CM

## 2021-12-09 ENCOUNTER — Ambulatory Visit
Admission: RE | Admit: 2021-12-09 | Discharge: 2021-12-09 | Disposition: A | Discharge status: Home / Self Care | Payer: BC Managed Care – PPO | Source: Ambulatory Visit | Attending: Obstetrics and Gynecology | Admitting: Obstetrics and Gynecology

## 2021-12-09 DIAGNOSIS — N63 Unspecified lump in unspecified breast: Secondary | ICD-10-CM

## 2021-12-09 DIAGNOSIS — R928 Other abnormal and inconclusive findings on diagnostic imaging of breast: Secondary | ICD-10-CM | POA: Diagnosis not present

## 2021-12-09 DIAGNOSIS — N6322 Unspecified lump in the left breast, upper inner quadrant: Secondary | ICD-10-CM | POA: Diagnosis not present

## 2022-02-22 ENCOUNTER — Encounter: Payer: BC Managed Care – PPO | Admitting: Obstetrics and Gynecology

## 2022-03-18 ENCOUNTER — Encounter: Payer: Self-pay | Admitting: Obstetrics and Gynecology

## 2022-03-18 ENCOUNTER — Other Ambulatory Visit (HOSPITAL_COMMUNITY)
Admission: RE | Admit: 2022-03-18 | Discharge: 2022-03-18 | Disposition: A | Discharge status: Home / Self Care | Payer: BC Managed Care – PPO | Source: Ambulatory Visit | Attending: Obstetrics and Gynecology | Admitting: Obstetrics and Gynecology

## 2022-03-18 ENCOUNTER — Ambulatory Visit (INDEPENDENT_AMBULATORY_CARE_PROVIDER_SITE_OTHER): Payer: BC Managed Care – PPO | Admitting: Obstetrics and Gynecology

## 2022-03-18 VITALS — BP 122/85 | HR 90 | Ht 66.0 in | Wt 173.8 lb

## 2022-03-18 DIAGNOSIS — N871 Moderate cervical dysplasia: Secondary | ICD-10-CM | POA: Diagnosis not present

## 2022-03-18 DIAGNOSIS — B977 Papillomavirus as the cause of diseases classified elsewhere: Secondary | ICD-10-CM

## 2022-03-18 DIAGNOSIS — N870 Mild cervical dysplasia: Secondary | ICD-10-CM | POA: Diagnosis not present

## 2022-03-18 DIAGNOSIS — N72 Inflammatory disease of cervix uteri: Secondary | ICD-10-CM | POA: Insufficient documentation

## 2022-03-18 NOTE — Progress Notes (Signed)
   HPI:  Heather Mcgee is a 41 y.o.  G1P1001  who presents today for evaluation and management of abnormal cervical cytology.    Dysplasia History:   History of CIN-2 by colposcopically directed biopsies.   ROS:  Pertinent items noted in HPI and remainder of comprehensive ROS otherwise negative.  OB History  Gravida Para Term Preterm AB Living  1 1 1     1   SAB IAB Ectopic Multiple Live Births          1    # Outcome Date GA Lbr Len/2nd Weight Sex Delivery Anes PTL Lv  1 Term 11/12/09   7 lb 12 oz (3.515 kg) M CS-LTranv EPI N LIV     Complications: Fetal Intolerance    Past Medical History:  Diagnosis Date   No pertinent past medical history     Past Surgical History:  Procedure Laterality Date   CESAREAN SECTION     CHOLECYSTECTOMY     EYE SURGERY      SOCIAL HISTORY:  Social History   Substance and Sexual Activity  Alcohol Use Yes   Comment: rare    Social History   Substance and Sexual Activity  Drug Use Never     Family History  Problem Relation Age of Onset   Hypertension Father    Breast cancer Neg Hx    Ovarian cancer Neg Hx    Colon cancer Neg Hx     ALLERGIES:  Patient has no known allergies.  She has a current medication list which includes the following prescription(s): fiber, magnesium, multivitamin, and norethindrone-ethinyl estradiol-iron.  Physical Exam: -Vitals:  BP 122/85   Pulse 90   Ht 5\' 6"  (1.676 m)   Wt 173 lb 12.8 oz (78.8 kg)   LMP 03/04/2022 (Exact Date)   BMI 28.05 kg/m   PROCEDURE: Colposcopy performed with 4% acetic acid after verbal consent obtained                           -Aceto-white Lesions Location(s): as above              -Biopsy performed at 10 and 12 o'clock               -ECC indicated and performed: No.     -Biopsy sites made hemostatic with pressure and Monsel's solution   -Satisfactory colposcopy: Yes.      -Evidence of Invasive cervical CA :  NO  ASSESSMENT:  Heather Mcgee is  a 41 y.o. G1P1001 here for  1. CIN II (cervical intraepithelial neoplasia II)   2. High risk human papilloma virus (HPV) infection of cervix   .  PLAN: 1.  I discussed the grading system of pap smears and HPV high risk viral types.  We will discuss management after colpo results return.  No orders of the defined types were placed in this encounter.          F/U  Return in about 2 weeks (around 04/01/2022).  44 ,MD 03/18/2022,9:00 AM

## 2022-03-18 NOTE — Progress Notes (Signed)
Patient presents today for 6 month follow-up colposcopy. Previous colposcopy resulted in CIN 2. She states no additional questions at this time

## 2022-03-19 LAB — SURGICAL PATHOLOGY

## 2022-03-21 NOTE — Progress Notes (Signed)
CIN I is a definite improvement from your last colpo.!! I would recommend a Pap in 1 year. Dr. Logan Bores

## 2022-03-22 ENCOUNTER — Encounter: Payer: Self-pay | Admitting: Obstetrics and Gynecology

## 2022-03-30 ENCOUNTER — Telehealth: Payer: BC Managed Care – PPO | Admitting: Obstetrics and Gynecology

## 2022-04-12 ENCOUNTER — Other Ambulatory Visit: Payer: Self-pay

## 2022-04-12 ENCOUNTER — Encounter: Payer: Self-pay | Admitting: Obstetrics and Gynecology

## 2022-04-12 DIAGNOSIS — Z3009 Encounter for other general counseling and advice on contraception: Secondary | ICD-10-CM

## 2022-04-12 MED ORDER — NORETHIN ACE-ETH ESTRAD-FE 1.5-30 MG-MCG PO TABS: 1.0000 | ORAL_TABLET | Freq: Every day | ORAL | 3 refills | Status: DC

## 2022-06-25 ENCOUNTER — Encounter: Payer: Self-pay | Admitting: Obstetrics and Gynecology

## 2022-06-29 ENCOUNTER — Ambulatory Visit: Payer: BC Managed Care – PPO | Admitting: Obstetrics and Gynecology

## 2022-08-05 DIAGNOSIS — L739 Follicular disorder, unspecified: Secondary | ICD-10-CM | POA: Diagnosis not present

## 2022-08-17 ENCOUNTER — Ambulatory Visit (INDEPENDENT_AMBULATORY_CARE_PROVIDER_SITE_OTHER): Payer: BC Managed Care – PPO | Admitting: Obstetrics and Gynecology

## 2022-08-17 ENCOUNTER — Encounter: Payer: Self-pay | Admitting: Obstetrics and Gynecology

## 2022-08-17 ENCOUNTER — Other Ambulatory Visit (HOSPITAL_COMMUNITY)
Admission: RE | Admit: 2022-08-17 | Discharge: 2022-08-17 | Disposition: A | Discharge status: Home / Self Care | Payer: BC Managed Care – PPO | Source: Ambulatory Visit | Attending: Obstetrics and Gynecology | Admitting: Obstetrics and Gynecology

## 2022-08-17 DIAGNOSIS — Z3009 Encounter for other general counseling and advice on contraception: Secondary | ICD-10-CM

## 2022-08-17 DIAGNOSIS — Z124 Encounter for screening for malignant neoplasm of cervix: Secondary | ICD-10-CM

## 2022-08-17 DIAGNOSIS — Z01419 Encounter for gynecological examination (general) (routine) without abnormal findings: Secondary | ICD-10-CM | POA: Diagnosis not present

## 2022-08-17 DIAGNOSIS — Z1231 Encounter for screening mammogram for malignant neoplasm of breast: Secondary | ICD-10-CM

## 2022-08-17 DIAGNOSIS — N870 Mild cervical dysplasia: Secondary | ICD-10-CM

## 2022-08-17 MED ORDER — NORETHIN ACE-ETH ESTRAD-FE 1.5-30 MG-MCG PO TABS: 1.0000 | ORAL_TABLET | Freq: Every day | ORAL | 3 refills | Status: DC

## 2022-08-17 NOTE — Progress Notes (Signed)
HPI:      Heather Mcgee is a 42 y.o. G1P1001 who LMP was No LMP recorded.  Subjective:   She presents today for her annual examination.  She has no complaints.  She is having normal regular cycles (light) while on OCPs.  She like to continue OCPs. Significant history includes history of CIN-2 by colposcopically directed biopsies.  This was followed by later colposcopy showing CIN-1.  This is her first Pap after diagnosis of CIN-1.    Hx: The following portions of the patient's history were reviewed and updated as appropriate:             She  has a past medical history of No pertinent past medical history. She does not have any pertinent problems on file. She  has a past surgical history that includes Cesarean section; Cholecystectomy; and Eye surgery. Her family history includes Hypertension in her father. She  reports that she has never smoked. She has never used smokeless tobacco. She reports current alcohol use. She reports that she does not use drugs. She has a current medication list which includes the following prescription(s): fiber, magnesium, multivitamin, and norethindrone-ethinyl estradiol-iron. She has No Known Allergies.       Review of Systems:  Review of Systems  Constitutional: Denied constitutional symptoms, night sweats, recent illness, fatigue, fever, insomnia and weight loss.  Eyes: Denied eye symptoms, eye pain, photophobia, vision change and visual disturbance.  Ears/Nose/Throat/Neck: Denied ear, nose, throat or neck symptoms, hearing loss, nasal discharge, sinus congestion and sore throat.  Cardiovascular: Denied cardiovascular symptoms, arrhythmia, chest pain/pressure, edema, exercise intolerance, orthopnea and palpitations.  Respiratory: Denied pulmonary symptoms, asthma, pleuritic pain, productive sputum, cough, dyspnea and wheezing.  Gastrointestinal: Denied, gastro-esophageal reflux, melena, nausea and vomiting.  Genitourinary: Denied genitourinary  symptoms including symptomatic vaginal discharge, pelvic relaxation issues, and urinary complaints.  Musculoskeletal: Denied musculoskeletal symptoms, stiffness, swelling, muscle weakness and myalgia.  Dermatologic: Denied dermatology symptoms, rash and scar.  Neurologic: Denied neurology symptoms, dizziness, headache, neck pain and syncope.  Psychiatric: Denied psychiatric symptoms, anxiety and depression.  Endocrine: Denied endocrine symptoms including hot flashes and night sweats.   Meds:   Current Outpatient Medications on File Prior to Visit  Medication Sig Dispense Refill   FIBER PO Take by mouth.     Magnesium 100 MG CAPS Take 300 mg by mouth daily.     Multiple Vitamin (MULTIVITAMIN) capsule Take by mouth.     No current facility-administered medications on file prior to visit.     Objective:     There were no vitals filed for this visit.  There were no vitals filed for this visit.            Physical examination General NAD, Conversant  HEENT Atraumatic; Op clear with mmm.  Normo-cephalic. Pupils reactive. Anicteric sclerae  Thyroid/Neck Smooth without nodularity or enlargement. Normal ROM.  Neck Supple.  Skin No rashes, lesions or ulceration. Normal palpated skin turgor. No nodularity.  Breasts: No masses or discharge.  Symmetric.  No axillary adenopathy.  Lungs: Clear to auscultation.No rales or wheezes. Normal Respiratory effort, no retractions.  Heart: NSR.  No murmurs or rubs appreciated. No peripheral edema  Abdomen: Soft.  Non-tender.  No masses.  No HSM. No hernia  Extremities: Moves all appropriately.  Normal ROM for age. No lymphadenopathy.  Neuro: Oriented to PPT.  Normal mood. Normal affect.     Pelvic:   Vulva: Normal appearance.  No lesions.  Vagina: No lesions or  abnormalities noted.  Support: Normal pelvic support.  Urethra No masses tenderness or scarring.  Meatus Normal size without lesions or prolapse.  Cervix: Normal appearance.  No lesions.   Anus: Normal exam.  No lesions.  Perineum: Normal exam.  No lesions.        Bimanual   Uterus: Normal size.  Non-tender.  Mobile.  AV.  Adnexae: No masses.  Non-tender to palpation.  Cul-de-sac: Negative for abnormality.     Assessment:    G1P1001 There are no problems to display for this patient.    1. Well woman exam with routine gynecological exam   2. Cervical cancer screening   3. CIN I (cervical intraepithelial neoplasia I)   4. Screening mammogram for breast cancer   5. Birth control counseling     Normal exam.  OCPs without problem  History of CIN-1   Plan:            1.  Basic Screening Recommendations The basic screening recommendations for asymptomatic women were discussed with the patient during her visit.  The age-appropriate recommendations were discussed with her and the rational for the tests reviewed.  When I am informed by the patient that another primary care physician has previously obtained the age-appropriate tests and they are up-to-date, only outstanding tests are ordered and referrals given as necessary.  Abnormal results of tests will be discussed with her when all of her results are completed.  Routine preventative health maintenance measures emphasized: Exercise/Diet/Weight control, Tobacco Warnings, Alcohol/Substance use risks and Stress Management Pap performed with viral typing. 2.  Continue OCPs.   Orders Orders Placed This Encounter  Procedures   MM DIGITAL SCREENING BILATERAL     Meds ordered this encounter  Medications   norethindrone-ethinyl estradiol-iron (LOESTRIN FE 1.5/30) 1.5-30 MG-MCG tablet    Sig: Take 1 tablet by mouth at bedtime.    Dispense:  84 tablet    Refill:  3          F/U  No follow-ups on file.  Finis Bud, M.D. 08/17/2022 1:47 PM

## 2022-08-17 NOTE — Progress Notes (Signed)
Patients presents for annual exam today. She states doing well with her current OCP, cycles are light and regular. Patient is due for pap smear, ordered. Due for mammogram, ordered. Annual labs are ordered. Patient states no other questions or concerns at this time.

## 2022-08-24 LAB — CYTOLOGY - PAP
Adequacy: ABSENT
Comment: NEGATIVE
Comment: NEGATIVE
Comment: NEGATIVE
Diagnosis: HIGH — AB
HPV 16: POSITIVE — AB
HPV 18 / 45: NEGATIVE
High risk HPV: POSITIVE — AB

## 2022-10-26 DIAGNOSIS — H0014 Chalazion left upper eyelid: Secondary | ICD-10-CM | POA: Diagnosis not present

## 2023-03-08 ENCOUNTER — Encounter: Payer: BC Managed Care – PPO | Admitting: Obstetrics and Gynecology

## 2023-03-08 DIAGNOSIS — R87613 High grade squamous intraepithelial lesion on cytologic smear of cervix (HGSIL): Secondary | ICD-10-CM

## 2023-03-08 DIAGNOSIS — R87810 Cervical high risk human papillomavirus (HPV) DNA test positive: Secondary | ICD-10-CM

## 2023-03-16 ENCOUNTER — Other Ambulatory Visit (HOSPITAL_COMMUNITY)
Admission: RE | Admit: 2023-03-16 | Discharge: 2023-03-16 | Disposition: A | Discharge status: Home / Self Care | Payer: BC Managed Care – PPO | Source: Ambulatory Visit | Attending: Obstetrics and Gynecology | Admitting: Obstetrics and Gynecology

## 2023-03-16 ENCOUNTER — Ambulatory Visit (INDEPENDENT_AMBULATORY_CARE_PROVIDER_SITE_OTHER): Payer: BC Managed Care – PPO | Admitting: Obstetrics and Gynecology

## 2023-03-16 ENCOUNTER — Encounter: Payer: Self-pay | Admitting: Obstetrics and Gynecology

## 2023-03-16 VITALS — BP 114/80 | HR 80 | Ht 66.0 in | Wt 186.3 lb

## 2023-03-16 DIAGNOSIS — R87613 High grade squamous intraepithelial lesion on cytologic smear of cervix (HGSIL): Secondary | ICD-10-CM

## 2023-03-16 DIAGNOSIS — N870 Mild cervical dysplasia: Secondary | ICD-10-CM | POA: Diagnosis not present

## 2023-03-16 DIAGNOSIS — B977 Papillomavirus as the cause of diseases classified elsewhere: Secondary | ICD-10-CM | POA: Diagnosis not present

## 2023-03-16 DIAGNOSIS — R87810 Cervical high risk human papillomavirus (HPV) DNA test positive: Secondary | ICD-10-CM | POA: Insufficient documentation

## 2023-03-16 NOTE — Progress Notes (Signed)
   HPI:  Heather Mcgee is a 42 y.o.  G1P1001  who presents today for evaluation and management of abnormal cervical cytology.    Dysplasia History: History of CIN-2 follow-up was found to be CIN-1 then follow-up Pap after that now shows high-grade HPV positive type 16.  She presents for colposcopy today.   HPV: Positive type 16 She is somewhat tired of getting follow-up Pap smears and colposcopies.   She also complains of significant symptoms during her menses with dysmenorrhea pelvic pressure and bloating which is getting worse.  She says that this has become disruptive of her lifestyle during several days each month.  ROS: See above  OB History  Gravida Para Term Preterm AB Living  1 1 1     1   SAB IAB Ectopic Multiple Live Births          1    # Outcome Date GA Lbr Len/2nd Weight Sex Type Anes PTL Lv  1 Term 11/12/09   7 lb 12 oz (3.515 kg) M CS-LTranv EPI N LIV     Complications: Fetal Intolerance    Past Medical History:  Diagnosis Date   No pertinent past medical history     Past Surgical History:  Procedure Laterality Date   CESAREAN SECTION     CHOLECYSTECTOMY     EYE SURGERY      SOCIAL HISTORY:  Social History   Substance and Sexual Activity  Alcohol Use Yes   Comment: rare    Social History   Substance and Sexual Activity  Drug Use Never     Family History  Problem Relation Age of Onset   Hypertension Father    Breast cancer Neg Hx    Ovarian cancer Neg Hx    Colon cancer Neg Hx     ALLERGIES:  Patient has no known allergies.  She has a current medication list which includes the following prescription(s): fiber, magnesium, multivitamin, and norethindrone-ethinyl estradiol-iron.  Physical Exam: -Vitals:  BP 114/80   Pulse 80   Ht 5\' 6"  (1.676 m)   Wt 186 lb 4.8 oz (84.5 kg)   LMP 03/08/2023   BMI 30.07 kg/m   PROCEDURE: Colposcopy performed with 4% acetic acid after verbal consent obtained                            -Aceto-white Lesions Location(s): See above              -Biopsy performed at 11 and 1130 o'clock               -ECC indicated and performed: No.     -Biopsy sites made hemostatic with pressure and Monsel's solution   -Satisfactory colposcopy: Yes.      -Evidence of Invasive cervical CA :  NO  ASSESSMENT:  Heather Mcgee is a 42 y.o. G1P1001 here for  1. HGSIL (high grade squamous intraepithelial lesion) on Pap smear of cervix   2. 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  PLAN: 1.  I discussed the grading system of pap smears and HPV high risk viral types.  We will discuss management after colpo results return.  No orders of the defined types were placed in this encounter.          F/U  Return in about 2 weeks (around 03/30/2023) for Video visit if desired.  Brennan Bailey ,MD 03/16/2023,2:47 PM

## 2023-03-16 NOTE — Progress Notes (Signed)
Patient presents today for a colposcopy. She had an abnormal pap smear resulting in hgsil/hpv 16+. No further concerns today.

## 2023-03-18 LAB — SURGICAL PATHOLOGY

## 2023-03-29 ENCOUNTER — Encounter: Payer: Self-pay | Admitting: Obstetrics and Gynecology

## 2023-03-29 ENCOUNTER — Ambulatory Visit (INDEPENDENT_AMBULATORY_CARE_PROVIDER_SITE_OTHER): Payer: BC Managed Care – PPO | Admitting: Obstetrics and Gynecology

## 2023-03-29 VITALS — BP 119/81 | HR 74 | Ht 66.0 in | Wt 185.0 lb

## 2023-03-29 DIAGNOSIS — N946 Dysmenorrhea, unspecified: Secondary | ICD-10-CM

## 2023-03-29 DIAGNOSIS — N890 Mild vaginal dysplasia: Secondary | ICD-10-CM | POA: Diagnosis not present

## 2023-03-29 DIAGNOSIS — N870 Mild cervical dysplasia: Secondary | ICD-10-CM

## 2023-03-29 NOTE — Progress Notes (Signed)
Patient presents today for a colposcopy follow-up. She reports no further concerns today.

## 2023-03-29 NOTE — Progress Notes (Signed)
HPI:      [name] is a 42 y.o. G1P1001 who LMP was Patient's last menstrual period was 03/08/2023.  Subjective:   She presents today for follow-up of her colposcopy.  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  Her most recent Pap smear was abnormal and she underwent a follow-up colposcopy again.  She has known type 16 HPV. She has expressed that she is tired of constantly following up abnormal Pap smears and colposcopies. Additionally, she has significant premenstrual symptoms of bloating and pelvic discomfort monthly.  This also includes some months of very heavy bleeding and cramping.  She reports that her period is disruptive to her life.  She has previously tried [employer] IUD and is currently taking OCPs.    Hx: The following portions of the patient's history were reviewed and updated as appropriate:             She  has a past medical history of No pertinent past medical history. She does not have any pertinent problems on file. She  has a past surgical history that includes Cesarean section; Cholecystectomy; and Eye surgery. Her family history includes Hypertension in her father. She  reports that she has never smoked. She has never used smokeless tobacco. She reports current alcohol use. She reports that she does not use drugs. She has a current medication list which includes the following prescription(s): fiber, magnesium, multivitamin, and norethindrone-ethinyl estradiol-iron. She has No Known Allergies.       Review of Systems:  Review of Systems  Constitutional: Denied constitutional symptoms, night sweats, recent illness, fatigue, fever, insomnia and weight loss.  Eyes: Denied eye symptoms, eye pain, photophobia, vision change and visual disturbance.  Ears/Nose/Throat/Neck: Denied ear, nose, throat or neck symptoms, hearing loss, nasal discharge, sinus congestion and sore throat.  Cardiovascular: Denied cardiovascular  symptoms, arrhythmia, chest pain/pressure, edema, exercise intolerance, orthopnea and palpitations.  Respiratory: Denied pulmonary symptoms, asthma, pleuritic pain, productive sputum, cough, dyspnea and wheezing.  Gastrointestinal: Denied, gastro-esophageal reflux, melena, nausea and vomiting.  Genitourinary: See HPI for additional information.  Musculoskeletal: Denied musculoskeletal symptoms, stiffness, swelling, muscle weakness and myalgia.  Dermatologic: Denied dermatology symptoms, rash and scar.  Neurologic: Denied neurology symptoms, dizziness, headache, neck pain and syncope.  Psychiatric: Denied psychiatric symptoms, anxiety and depression.  Endocrine: Denied endocrine symptoms including hot flashes and night sweats.   Meds:   Current Outpatient Medications on File Prior to Visit  Medication Sig Dispense Refill   FIBER PO Take by mouth.     Magnesium 100 MG CAPS Take 300 mg by mouth daily.     Multiple Vitamin (MULTIVITAMIN) capsule Take by mouth.     norethindrone-ethinyl estradiol-iron (LOESTRIN FE 1.5/30) 1.5-30 MG-MCG tablet Take 1 tablet by mouth at bedtime. 84 tablet 3   No current facility-administered medications on file prior to visit.      Objective:     Vitals:   03/29/23 1000  BP: 119/81  Pulse: 74   Filed Weights   03/29/23 1000  Weight: 185 lb (83.9 kg)                        Assessment:    G1P1001 There are no problems to display for this patient.    1. 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 Dysmenorrhea     By colposcopically directed biopsies  Plan:            1.  We have discussed multiple options regarding CIN-1.  She had CIN-2 and this has decreased to CIN-1 and now CIN-1 has been stable.  She is understandably not happy with the constant follow-up of this condition.  We have discussed the possibility of LEEP as well as expectant management as well as hysterectomy  (see below  2.  Patient feels as if her menses have become worse despite use of OCPs and multiple failed attempts at cycle control and using other hormonal methods.  Her life is being disrupted.  She would like to consider the possibility of hysterectomy to take care of both of the problems noted above. We have discussed multiple strategies and she will contact us via MyChart when she has made a decision regarding above management.  Orders No orders of the defined types were placed in this encounter.   No orders of the defined types were placed in this encounter.     F/U  No follow-ups on file. I spent 23  minutes involved in the care of this patient preparing to see the patient by obtaining and reviewing her medical history (including labs, imaging tests and prior procedures), documenting clinical information in the electronic health record (EHR), counseling and coordinating care plans, writing and sending prescriptions, ordering tests or procedures and in direct communicating with the patient and medical staff discussing pertinent items from her history and physical exam.  Elonda Husky, M.D. 03/29/2023 10:50 AM

## 2023-04-01 ENCOUNTER — Encounter: Payer: Self-pay | Admitting: Obstetrics and Gynecology

## 2023-08-16 ENCOUNTER — Ambulatory Visit (INDEPENDENT_AMBULATORY_CARE_PROVIDER_SITE_OTHER): Payer: BC Managed Care – PPO | Admitting: Obstetrics and Gynecology

## 2023-08-16 ENCOUNTER — Encounter: Payer: Self-pay | Admitting: Obstetrics and Gynecology

## 2023-08-16 VITALS — BP 124/85 | HR 82 | Ht 66.0 in | Wt 186.3 lb

## 2023-08-16 DIAGNOSIS — N946 Dysmenorrhea, unspecified: Secondary | ICD-10-CM | POA: Diagnosis not present

## 2023-08-16 DIAGNOSIS — N870 Mild cervical dysplasia: Secondary | ICD-10-CM | POA: Diagnosis not present

## 2023-08-16 NOTE — Progress Notes (Signed)
Patient presents today to discuss surgical options and hysterectomy. She states her desire for this is due to ongoing colposcopies in the past and painful menstrual cycles.

## 2023-08-16 NOTE — Progress Notes (Signed)
 HPI:      [name] is a 43 y.o. G1P1001 who LMP was Patient's last menstrual period was 07/20/2023.  Subjective:   She presents today because she is once again considering definitive surgery for her significant dysmenorrhea.  She has regular menses that disrupt her daily activities because of menstrual cramping and pain.  She has tried multiple hormonal methods to control the cycles including OCPs, Nexplanon, IUD.  These have all failed. In addition, she has a history of CIN-2 which she has been following with frequent colposcopic examinations.  She is understandably tired of repetitively having abnormal Pap smears and colposcopies.  We have discussed the possibility of LEEP but because of her dysmenorrhea she would like to consider having a hysterectomy to solve both problems at once.    Hx: The following portions of the patient's history were reviewed and updated as appropriate:             She  has a past medical history of No pertinent past medical history. She does not have any pertinent problems on file. She  has a past surgical history that includes Cesarean section; Cholecystectomy; and Eye surgery. Her family history includes Hypertension in her father. She  reports that she has never smoked. She has never used smokeless tobacco. She reports current alcohol use. She reports that she does not use drugs. She has a current medication list which includes the following prescription(s): fiber, magnesium, multivitamin, tirzepatide-weight management, and norethindrone-ethinyl estradiol -iron. She has no known allergies.       Review of Systems:  Review of Systems  Constitutional: Denied constitutional symptoms, night sweats, recent illness, fatigue, fever, insomnia and weight loss.  Eyes: Denied eye symptoms, eye pain, photophobia, vision change and visual disturbance.  Ears/Nose/Throat/Neck: Denied ear, nose, throat or neck symptoms, hearing loss, nasal discharge, sinus  congestion and sore throat.  Cardiovascular: Denied cardiovascular symptoms, arrhythmia, chest pain/pressure, edema, exercise intolerance, orthopnea and palpitations.  Respiratory: Denied pulmonary symptoms, asthma, pleuritic pain, productive sputum, cough, dyspnea and wheezing.  Gastrointestinal: Denied, gastro-esophageal reflux, melena, nausea and vomiting.  Genitourinary: See HPI for additional information.  Musculoskeletal: Denied musculoskeletal symptoms, stiffness, swelling, muscle weakness and myalgia.  Dermatologic: Denied dermatology symptoms, rash and scar.  Neurologic: Denied neurology symptoms, dizziness, headache, neck pain and syncope.  Psychiatric: Denied psychiatric symptoms, anxiety and depression.  Endocrine: Denied endocrine symptoms including hot flashes and night sweats.   Meds:   Current Outpatient Medications on File Prior to Visit  Medication Sig Dispense Refill   FIBER PO Take by mouth.     Magnesium 100 MG CAPS Take 300 mg by mouth daily.     Multiple Vitamin (MULTIVITAMIN) capsule Take by mouth.     Tirzepatide-Weight Management (ZEPBOUND [place]) Inject into the skin.     norethindrone-ethinyl estradiol -iron (LOESTRIN  FE 1.5/30) 1.5-30 MG-MCG tablet Take 1 tablet by mouth at bedtime. 84 tablet 3   No current facility-administered medications on file prior to visit.      Objective:     Vitals:   08/16/23 1443  BP: 124/85  Pulse: 82   Filed Weights   08/16/23 1443  Weight: 186 lb 4.8 oz (84.5 kg)                        Assessment:    G1P1001 There are no active problems to display for this patient.    1. Dysmenorrhea   2. CIN I (cervical intraepithelial neoplasia I)  Patient has a history of CIN-2.  Last colpo showed CIN-1.   Painful disruptive dysmenorrhea   Plan:            1.  We have discussed options including LEEP and exploratory laparoscopy to help to try to solve her pain issues as well as her abnormal Pap/colposcopy issues.   She would much prefer definitive management with hysterectomy because she has previously tried hormonal cycling methods without success and does not want to continuously have follow-up colposcopies. LAVH discussed -she would like to schedule a preop appointment. Orders No orders of the defined types were placed in this encounter.   No orders of the defined types were placed in this encounter.     F/U  Return in about 2 weeks (around 08/30/2023).  Alm DOROTHA Sar, M.D. 08/16/2023 3:25 PM

## 2023-08-30 ENCOUNTER — Ambulatory Visit: Payer: BC Managed Care – PPO | Admitting: Obstetrics and Gynecology

## 2023-08-30 ENCOUNTER — Other Ambulatory Visit: Payer: Self-pay | Admitting: Obstetrics and Gynecology

## 2023-08-30 ENCOUNTER — Encounter: Payer: Self-pay | Admitting: Obstetrics and Gynecology

## 2023-08-30 VITALS — BP 128/83 | HR 92 | Ht 66.0 in | Wt 186.9 lb

## 2023-08-30 DIAGNOSIS — N870 Mild cervical dysplasia: Secondary | ICD-10-CM

## 2023-08-30 DIAGNOSIS — Z3009 Encounter for other general counseling and advice on contraception: Secondary | ICD-10-CM

## 2023-08-30 DIAGNOSIS — Z01818 Encounter for other preprocedural examination: Secondary | ICD-10-CM | POA: Diagnosis not present

## 2023-08-30 DIAGNOSIS — N946 Dysmenorrhea, unspecified: Secondary | ICD-10-CM | POA: Diagnosis not present

## 2023-08-30 NOTE — Progress Notes (Signed)
 PRE-OPERATIVE HISTORY AND PHYSICAL EXAM  PCP:  Patient, No Pcp Per Subjective:   HPI:  Heather Mcgee is a 43 y.o. G1P1001.  Patient's last menstrual period was 07/20/2023.  She presents today for a pre-op discussion and PE.  She has the following symptoms: Disruptive dysmenorrhea, persistent CIN-2  Review of Systems:   Constitutional: Denied constitutional symptoms, night sweats, recent illness, fatigue, fever, insomnia and weight loss.  Eyes: Denied eye symptoms, eye pain, photophobia, vision change and visual disturbance.  Ears/Nose/Throat/Neck: Denied ear, nose, throat or neck symptoms, hearing loss, nasal discharge, sinus congestion and sore throat.  Cardiovascular: Denied cardiovascular symptoms, arrhythmia, chest pain/pressure, edema, exercise intolerance, orthopnea and palpitations.  Respiratory: Denied pulmonary symptoms, asthma, pleuritic pain, productive sputum, cough, dyspnea and wheezing.  Gastrointestinal: Denied, gastro-esophageal reflux, melena, nausea and vomiting.  Genitourinary: See HPI for additional information.  Musculoskeletal: Denied musculoskeletal symptoms, stiffness, swelling, muscle weakness and myalgia.  Dermatologic: Denied dermatology symptoms, rash and scar.  Neurologic: Denied neurology symptoms, dizziness, headache, neck pain and syncope.  Psychiatric: Denied psychiatric symptoms, anxiety and depression.  Endocrine: Denied endocrine symptoms including hot flashes and night sweats.   OB History  Gravida Para Term Preterm AB Living  1 1 1   1   SAB IAB Ectopic Multiple Live Births      1    # Outcome Date GA Lbr Len/2nd Weight Sex Type Anes PTL Lv  1 Term 11/12/09   7 lb 12 oz (3.515 kg) M CS-LTranv EPI N LIV     Complications: Fetal Intolerance    Past Medical History:  Diagnosis Date   No pertinent past medical history     Past Surgical History:  Procedure Laterality Date   CESAREAN SECTION     CHOLECYSTECTOMY     EYE  SURGERY        SOCIAL HISTORY:  Social History   Tobacco Use  Smoking Status Never  Smokeless Tobacco Never   Social History   Substance and Sexual Activity  Alcohol Use Yes   Comment: rare    Social History   Substance and Sexual Activity  Drug Use Never    Family History  Problem Relation Age of Onset   Hypertension Father    Breast cancer Neg Hx    Ovarian cancer Neg Hx    Colon cancer Neg Hx     ALLERGIES:  Patient has no known allergies.  MEDS:   Current Outpatient Medications on File Prior to Visit  Medication Sig Dispense Refill   FIBER PO Take by mouth.     Magnesium 100 MG CAPS Take 300 mg by mouth daily.     Multiple Vitamin (MULTIVITAMIN) capsule Take by mouth.     Tirzepatide-Weight Management (ZEPBOUND [place]) Inject into the skin.     No current facility-administered medications on file prior to visit.    No orders of the defined types were placed in this encounter.    Physical examination BP 128/83   Pulse 92   Ht 5\' 6"  (1.676 m)   Wt 186 lb 14.4 oz (84.8 kg)   LMP 07/20/2023   BMI 30.17 kg/m   General NAD, Conversant  HEENT Atraumatic; Op clear with mmm.  Normo-cephalic.  Anicteric sclerae  Thyroid/Neck Smooth without nodularity or enlargement. Normal ROM.  Neck Supple.  Skin No rashes, lesions or ulceration. Normal palpated skin turgor. No nodularity.  Breasts: No masses or discharge.  Symmetric.  No axillary adenopathy.  Lungs: Clear to auscultation.No rales or wheezes. Normal Respiratory effort, no retractions.  Heart: NSR.  No murmurs or rubs appreciated. No peripheral edema  Abdomen: Soft.  Non-tender.  No masses.  No HSM. No hernia  Extremities: Moves all appropriately.  Normal ROM for age. No lymphadenopathy.  Neuro: Oriented to PPT.  Normal mood. Normal affect.     Pelvic:   Vulva: Normal appearance.  No lesions.  Vagina: No lesions or abnormalities noted.  Support: Normal pelvic support.  Urethra No masses tenderness or  scarring.  Meatus Normal size without lesions or prolapse.  Cervix: Normal ectropion.  No lesions.  Anus: Normal exam.  No lesions.  Perineum: Normal exam.  No lesions.        Bimanual   Uterus: Normal size.  Non-tender.  Mobile.  AV.  Adnexae: No masses.  Non-tender to palpation.  Cul-de-sac: Negative for abnormality.   Assessment:   G1P1001 There are no active problems to display for this patient.   1. Pre-op exam   2. Dysmenorrhea   3. CIN I (cervical intraepithelial neoplasia I)     She continues to have abnormal Pap smears and has persisted cytologic abnormality both on Pap and colposcopy.  Most colposcopies show CIN-2 over the last few years.  She has dysmenorrhea with each menses that is not controlled by IUD, Nexplanon, OCPs.  She has failed all of the above.  Plan:   Orders: No orders of the defined types were placed in this encounter.    1.  LAVH bilateral salpingectomy  Pre-op discussions regarding Risks and Benefits of her scheduled surgery.  LAVH The procedure of Laparoscopic Assisted Vaginal Hysterectomy was described to the patient in detail.  We reviewed the rationale for Hysterectomy and the patient was again informed of other nonsurgical management possibilities for her condition.  She has considered these other options, and desires a Hysterectomy.  We have reviewed the fact that Hysterectomy is permanent and that following the procedure she will not be able to become pregnant or bear children.  We have discussed the following risk factors specifically and the patient has also been informed that additional complications not mentioned may develop:  Damage to bowel, bladder, ureters or to other internal organs, bleeding, infection and the risk from anesthesia.  We have discussed the procedure itself in detail and she has an informed understanding of this surgery.  We have also discussed the recovery period in which physical and sexual activity will be restricted for  a varying degree of time, often 3 - 6 weeks. The Laparoscopic Portion of Hysterectomy has also been reviewed with the patient.  She understands how the laparoscope facilitates the procedure.  We have discussed the abdominal incisions and punctures that will be used.  We have also reviewed the increased Operating Room time often accompanying LAVH.  The slightly increased risk of complications secondary to abdominal punctures, and use of laparoscopic instrumentation has also been discussed in detail.I have answered all of her questions and I believe the patient has an informed understanding of the procedure of Laparoscopic Assisted Vaginal Hysterectomy.   Elonda Husky, M.D. 08/30/2023 4:41 PM

## 2023-08-30 NOTE — H&P (View-Only) (Signed)
 PRE-OPERATIVE HISTORY AND PHYSICAL EXAM  PCP:  Patient, No Pcp Per Subjective:   HPI:  Heather Mcgee is a 43 y.o. G1P1001.  Patient's last menstrual period was 07/20/2023.  She presents today for a pre-op discussion and PE.  She has the following symptoms: Disruptive dysmenorrhea, persistent CIN-2  Review of Systems:   Constitutional: Denied constitutional symptoms, night sweats, recent illness, fatigue, fever, insomnia and weight loss.  Eyes: Denied eye symptoms, eye pain, photophobia, vision change and visual disturbance.  Ears/Nose/Throat/Neck: Denied ear, nose, throat or neck symptoms, hearing loss, nasal discharge, sinus congestion and sore throat.  Cardiovascular: Denied cardiovascular symptoms, arrhythmia, chest pain/pressure, edema, exercise intolerance, orthopnea and palpitations.  Respiratory: Denied pulmonary symptoms, asthma, pleuritic pain, productive sputum, cough, dyspnea and wheezing.  Gastrointestinal: Denied, gastro-esophageal reflux, melena, nausea and vomiting.  Genitourinary: See HPI for additional information.  Musculoskeletal: Denied musculoskeletal symptoms, stiffness, swelling, muscle weakness and myalgia.  Dermatologic: Denied dermatology symptoms, rash and scar.  Neurologic: Denied neurology symptoms, dizziness, headache, neck pain and syncope.  Psychiatric: Denied psychiatric symptoms, anxiety and depression.  Endocrine: Denied endocrine symptoms including hot flashes and night sweats.   OB History  Gravida Para Term Preterm AB Living  1 1 1   1   SAB IAB Ectopic Multiple Live Births      1    # Outcome Date GA Lbr Len/2nd Weight Sex Type Anes PTL Lv  1 Term 11/12/09   7 lb 12 oz (3.515 kg) M CS-LTranv EPI N LIV     Complications: Fetal Intolerance    Past Medical History:  Diagnosis Date   No pertinent past medical history     Past Surgical History:  Procedure Laterality Date   CESAREAN SECTION     CHOLECYSTECTOMY     EYE  SURGERY        SOCIAL HISTORY:  Social History   Tobacco Use  Smoking Status Never  Smokeless Tobacco Never   Social History   Substance and Sexual Activity  Alcohol Use Yes   Comment: rare    Social History   Substance and Sexual Activity  Drug Use Never    Family History  Problem Relation Age of Onset   Hypertension Father    Breast cancer Neg Hx    Ovarian cancer Neg Hx    Colon cancer Neg Hx     ALLERGIES:  Patient has no known allergies.  MEDS:   Current Outpatient Medications on File Prior to Visit  Medication Sig Dispense Refill   FIBER PO Take by mouth.     Magnesium 100 MG CAPS Take 300 mg by mouth daily.     Multiple Vitamin (MULTIVITAMIN) capsule Take by mouth.     Tirzepatide-Weight Management (ZEPBOUND [place]) Inject into the skin.     No current facility-administered medications on file prior to visit.    No orders of the defined types were placed in this encounter.    Physical examination BP 128/83   Pulse 92   Ht 5\' 6"  (1.676 m)   Wt 186 lb 14.4 oz (84.8 kg)   LMP 07/20/2023   BMI 30.17 kg/m   General NAD, Conversant  HEENT Atraumatic; Op clear with mmm.  Normo-cephalic.  Anicteric sclerae  Thyroid/Neck Smooth without nodularity or enlargement. Normal ROM.  Neck Supple.  Skin No rashes, lesions or ulceration. Normal palpated skin turgor. No nodularity.  Breasts: No masses or discharge.  Symmetric.  No axillary adenopathy.  Lungs:  Clear to auscultation.No rales or wheezes. Normal Respiratory effort, no retractions.  Heart: NSR.  No murmurs or rubs appreciated. No peripheral edema  Abdomen: Soft.  Non-tender.  No masses.  No HSM. No hernia  Extremities: Moves all appropriately.  Normal ROM for age. No lymphadenopathy.  Neuro: Oriented to PPT.  Normal mood. Normal affect.     Pelvic:   Vulva: Normal appearance.  No lesions.  Vagina: No lesions or abnormalities noted.  Support: Normal pelvic support.  Urethra No masses tenderness or  scarring.  Meatus Normal size without lesions or prolapse.  Cervix: Normal ectropion.  No lesions.  Anus: Normal exam.  No lesions.  Perineum: Normal exam.  No lesions.        Bimanual   Uterus: Normal size.  Non-tender.  Mobile.  AV.  Adnexae: No masses.  Non-tender to palpation.  Cul-de-sac: Negative for abnormality.   Assessment:   G1P1001 There are no active problems to display for this patient.   1. Pre-op exam   2. Dysmenorrhea   3. CIN I (cervical intraepithelial neoplasia I)     She continues to have abnormal Pap smears and has persisted cytologic abnormality both on Pap and colposcopy.  Most colposcopies show CIN-2 over the last few years.  She has dysmenorrhea with each menses that is not controlled by IUD, Nexplanon, OCPs.  She has failed all of the above.  Plan:   Orders: No orders of the defined types were placed in this encounter.    1.  LAVH bilateral salpingectomy

## 2023-08-30 NOTE — Progress Notes (Signed)
Patient presents today for a pre-op exam prior to LAVH. She states no additional concerns today.

## 2023-08-30 NOTE — H&P (Signed)
 PRE-OPERATIVE HISTORY AND PHYSICAL EXAM  PCP:  Patient, No Pcp Per Subjective:   HPI:  Heather Mcgee is a 43 y.o. G1P1001.  Patient's last menstrual period was 07/20/2023.  She presents today for a pre-op discussion and PE.  She has the following symptoms: Disruptive dysmenorrhea, persistent CIN-2  Review of Systems:   Constitutional: Denied constitutional symptoms, night sweats, recent illness, fatigue, fever, insomnia and weight loss.  Eyes: Denied eye symptoms, eye pain, photophobia, vision change and visual disturbance.  Ears/Nose/Throat/Neck: Denied ear, nose, throat or neck symptoms, hearing loss, nasal discharge, sinus congestion and sore throat.  Cardiovascular: Denied cardiovascular symptoms, arrhythmia, chest pain/pressure, edema, exercise intolerance, orthopnea and palpitations.  Respiratory: Denied pulmonary symptoms, asthma, pleuritic pain, productive sputum, cough, dyspnea and wheezing.  Gastrointestinal: Denied, gastro-esophageal reflux, melena, nausea and vomiting.  Genitourinary: See HPI for additional information.  Musculoskeletal: Denied musculoskeletal symptoms, stiffness, swelling, muscle weakness and myalgia.  Dermatologic: Denied dermatology symptoms, rash and scar.  Neurologic: Denied neurology symptoms, dizziness, headache, neck pain and syncope.  Psychiatric: Denied psychiatric symptoms, anxiety and depression.  Endocrine: Denied endocrine symptoms including hot flashes and night sweats.   OB History  Gravida Para Term Preterm AB Living  1 1 1   1   SAB IAB Ectopic Multiple Live Births      1    # Outcome Date GA Lbr Len/2nd Weight Sex Type Anes PTL Lv  1 Term 11/12/09   7 lb 12 oz (3.515 kg) M CS-LTranv EPI N LIV     Complications: Fetal Intolerance    Past Medical History:  Diagnosis Date   No pertinent past medical history     Past Surgical History:  Procedure Laterality Date   CESAREAN SECTION     CHOLECYSTECTOMY     EYE  SURGERY        SOCIAL HISTORY:  Social History   Tobacco Use  Smoking Status Never  Smokeless Tobacco Never   Social History   Substance and Sexual Activity  Alcohol Use Yes   Comment: rare    Social History   Substance and Sexual Activity  Drug Use Never    Family History  Problem Relation Age of Onset   Hypertension Father    Breast cancer Neg Hx    Ovarian cancer Neg Hx    Colon cancer Neg Hx     ALLERGIES:  Patient has no known allergies.  MEDS:   Current Outpatient Medications on File Prior to Visit  Medication Sig Dispense Refill   FIBER PO Take by mouth.     Magnesium 100 MG CAPS Take 300 mg by mouth daily.     Multiple Vitamin (MULTIVITAMIN) capsule Take by mouth.     Tirzepatide-Weight Management (ZEPBOUND [place]) Inject into the skin.     No current facility-administered medications on file prior to visit.    No orders of the defined types were placed in this encounter.    Physical examination BP 128/83   Pulse 92   Ht 5\' 6"  (1.676 m)   Wt 186 lb 14.4 oz (84.8 kg)   LMP 07/20/2023   BMI 30.17 kg/m   General NAD, Conversant  HEENT Atraumatic; Op clear with mmm.  Normo-cephalic.  Anicteric sclerae  Thyroid/Neck Smooth without nodularity or enlargement. Normal ROM.  Neck Supple.  Skin No rashes, lesions or ulceration. Normal palpated skin turgor. No nodularity.  Breasts: No masses or discharge.  Symmetric.  No axillary adenopathy.  Lungs:  Clear to auscultation.No rales or wheezes. Normal Respiratory effort, no retractions.  Heart: NSR.  No murmurs or rubs appreciated. No peripheral edema  Abdomen: Soft.  Non-tender.  No masses.  No HSM. No hernia  Extremities: Moves all appropriately.  Normal ROM for age. No lymphadenopathy.  Neuro: Oriented to PPT.  Normal mood. Normal affect.     Pelvic:   Vulva: Normal appearance.  No lesions.  Vagina: No lesions or abnormalities noted.  Support: Normal pelvic support.  Urethra No masses tenderness or  scarring.  Meatus Normal size without lesions or prolapse.  Cervix: Normal ectropion.  No lesions.  Anus: Normal exam.  No lesions.  Perineum: Normal exam.  No lesions.        Bimanual   Uterus: Normal size.  Non-tender.  Mobile.  AV.  Adnexae: No masses.  Non-tender to palpation.  Cul-de-sac: Negative for abnormality.   Assessment:   G1P1001 There are no active problems to display for this patient.   1. Pre-op exam   2. Dysmenorrhea   3. CIN I (cervical intraepithelial neoplasia I)     She continues to have abnormal Pap smears and has persisted cytologic abnormality both on Pap and colposcopy.  Most colposcopies show CIN-2 over the last few years.  She has dysmenorrhea with each menses that is not controlled by IUD, Nexplanon, OCPs.  She has failed all of the above.  Plan:   Orders: No orders of the defined types were placed in this encounter.    1.  LAVH bilateral salpingectomy

## 2023-09-19 ENCOUNTER — Encounter
Admission: RE | Admit: 2023-09-19 | Discharge: 2023-09-19 | Disposition: A | Discharge status: Home / Self Care | Payer: BC Managed Care – PPO | Source: Ambulatory Visit | Attending: Obstetrics and Gynecology | Admitting: Obstetrics and Gynecology

## 2023-09-19 ENCOUNTER — Other Ambulatory Visit: Payer: Self-pay

## 2023-09-19 VITALS — Ht 66.0 in | Wt 175.0 lb

## 2023-09-19 DIAGNOSIS — Z01812 Encounter for preprocedural laboratory examination: Secondary | ICD-10-CM

## 2023-09-19 HISTORY — DX: Anxiety disorder, unspecified: F41.9

## 2023-09-19 HISTORY — DX: Dizziness and giddiness: R42

## 2023-09-19 HISTORY — DX: Other fatigue: R53.83

## 2023-09-19 HISTORY — DX: Low grade squamous intraepithelial lesion on cytologic smear of cervix (LGSIL): R87.612

## 2023-09-19 HISTORY — DX: Anxiety disorder, unspecified: F51.05

## 2023-09-19 NOTE — Patient Instructions (Addendum)
 Your procedure is scheduled on: Monday March 17  Report to the Registration Desk on the 1st floor of the [facility] To find out your arrival time, please call [phone] between 1PM - 3PM on:  Friday  March 14  If your arrival time is 6:00 am, do not arrive before that time as the [facility] entrance doors do not open until 6:00 am.  REMEMBER: Instructions that are not followed completely may result in serious medical risk, up to and including death; or upon the discretion of your surgeon and anesthesiologist your surgery may need to be rescheduled.  Do not eat food after midnight the night before surgery.  No gum chewing or hard candies.   One week prior to surgery:  Starting Monday March 10  Stop Anti-inflammatories (NSAIDS) such as Advil, Aleve, Ibuprofen, Motrin, Naproxen, Naprosyn and Aspirin based products such as Excedrin, Goody's Powder, BC Powder. Stop ANY OVER THE COUNTER supplements until after surgery. MAGNESIUM PO   You may however, continue to take Tylenol if needed for pain up until the day of surgery. ZEPBOUND hold for 7 days prior to surgery. Last dose Sunday March 9 .  Continue taking all of your other prescription medications up until the day of surgery.  ON THE DAY OF SURGERY DO NOT TAKE ANY  MEDICATIONS  No Alcohol for 24 hours before or after surgery.  No Smoking including e-cigarettes for 24 hours before surgery.  No chewable tobacco products for at least 6 hours before surgery.  No nicotine patches on the day of surgery.  Do not use any "recreational" drugs for at least a week (preferably 2 weeks) before your surgery.  Please be advised that the combination of cocaine and anesthesia may have negative outcomes, up to and including death. If you test positive for cocaine, your surgery will be cancelled.  On the morning of surgery brush your teeth with toothpaste and water, you may rinse your mouth with mouthwash if you wish. Do not swallow any  toothpaste or mouthwash.  Use CHG Soap directed on instruction sheet.  Do not wear jewelry, make-up, hairpins, clips or nail polish.  For welded (permanent) jewelry: bracelets, anklets, waist bands, etc.  Please have this removed prior to surgery.  If it is not removed, there is a chance that hospital personnel will need to cut it off on the day of surgery.  Do not wear lotions, powders, or perfumes.   Do not shave body hair from the neck down 48 hours before surgery.  Contact lenses, hearing aids and dentures may not be worn into surgery.  Do not bring valuables to the hospital. [facility] is not responsible for any missing/lost belongings or valuables.   Notify your doctor if there is any change in your medical condition (cold, fever, infection).  Wear comfortable clothing (specific to your surgery type) to the hospital.  After surgery, you can help prevent lung complications by doing breathing exercises.  Take deep breaths and cough every 1-2 hours.   When coughing or sneezing, hold a pillow firmly against your incision with both hands. This is called "splinting." Doing this helps protect your incision. It also decreases belly discomfort.  If you are being discharged the day of surgery, you will not be allowed to drive home. You will need a responsible individual to drive you home and stay with you for 24 hours after surgery.   If you are taking public transportation, you will need to have a responsible individual with  you.  Please call the Pre-admissions Testing Dept. at [phone] if you have any questions about these instructions.  Surgery Visitation Policy:  Patients having surgery or a procedure may have two visitors.  Children under the age of 40 must have an adult with them who is not the patient.  Temporary Visitor Restrictions Due to increasing cases of flu, RSV and COVID-19: Children ages 70 and under will not be able to visit patients in [facility] hospitals  under most circumstances.      Preparing for Surgery with CHLORHEXIDINE GLUCONATE (CHG) Soap  Chlorhexidine Gluconate (CHG) Soap  o An antiseptic cleaner that kills germs and bonds with the skin to continue killing germs even after washing  o Used for showering the night before surgery and morning of surgery  Before surgery, you can play an important role by reducing the number of germs on your skin.  CHG (Chlorhexidine gluconate) soap is an antiseptic cleanser which kills germs and bonds with the skin to continue killing germs even after washing.  Please do not use if you have an allergy to CHG or antibacterial soaps. If your skin becomes reddened/irritated stop using the CHG.  1. Shower the NIGHT BEFORE SURGERY and the MORNING OF SURGERY with CHG soap.  2. If you choose to wash your hair, wash your hair first as usual with your normal shampoo.  3. After shampooing, rinse your hair and body thoroughly to remove the shampoo.  4. Use CHG as you would any other liquid soap. You can apply CHG directly to the skin and wash gently with a scrungie or a clean washcloth.  5. Apply the CHG soap to your body only from the neck down. Do not use on open wounds or open sores. Avoid contact with your eyes, ears, mouth, and genitals (private parts). Wash face and genitals (private parts) with your normal soap.  6. Wash thoroughly, paying special attention to the area where your surgery will be performed.  7. Thoroughly rinse your body with warm water.  8. Do not shower/wash with your normal soap after using and rinsing off the CHG soap.  9. Pat yourself dry with a clean towel.  10. Wear clean pajamas to bed the night before surgery.  12. Place clean sheets on your bed the night of your first shower and do not sleep with pets.  13. Shower again with the CHG soap on the day of surgery prior to arriving at the hospital.  14. Do not apply any deodorants/lotions/powders.  15. Please wear  clean clothes to the hospital.

## 2023-09-20 ENCOUNTER — Encounter
Admission: RE | Admit: 2023-09-20 | Discharge: 2023-09-20 | Disposition: A | Discharge status: Home / Self Care | Source: Ambulatory Visit | Attending: Obstetrics and Gynecology | Admitting: Obstetrics and Gynecology

## 2023-09-20 DIAGNOSIS — E538 Deficiency of other specified B group vitamins: Secondary | ICD-10-CM | POA: Diagnosis not present

## 2023-09-20 DIAGNOSIS — Z01812 Encounter for preprocedural laboratory examination: Secondary | ICD-10-CM | POA: Diagnosis not present

## 2023-09-20 DIAGNOSIS — T50905A Adverse effect of unspecified drugs, medicaments and biological substances, initial encounter: Secondary | ICD-10-CM | POA: Diagnosis not present

## 2023-09-20 DIAGNOSIS — Z01818 Encounter for other preprocedural examination: Secondary | ICD-10-CM

## 2023-09-20 DIAGNOSIS — R5383 Other fatigue: Secondary | ICD-10-CM | POA: Diagnosis not present

## 2023-09-20 DIAGNOSIS — R634 Abnormal weight loss: Secondary | ICD-10-CM | POA: Diagnosis not present

## 2023-09-20 LAB — BASIC METABOLIC PANEL
Anion gap: 11 (ref 5–15)
BUN: 17 mg/dL (ref 6–20)
CO2: 21 mmol/L — ABNORMAL LOW (ref 22–32)
Calcium: 9.3 mg/dL (ref 8.9–10.3)
Chloride: 106 mmol/L (ref 98–111)
Creatinine, Ser: 0.92 mg/dL (ref 0.44–1.00)
GFR, Estimated: >60 mL/min (ref >60)
Glucose, Bld: 108 mg/dL — ABNORMAL HIGH (ref 70–99)
Potassium: 3.6 mmol/L (ref 3.5–5.1)
Sodium: 138 mmol/L (ref 135–145)

## 2023-09-20 LAB — TYPE AND SCREEN
ABO/RH(D): O POS
Antibody Screen: NEGATIVE

## 2023-09-20 LAB — CBC
HCT: 41.7 % (ref 36.0–46.0)
Hemoglobin: 14.3 g/dL (ref 12.0–15.0)
MCH: 30.8 pg (ref 26.0–34.0)
MCHC: 34.3 g/dL (ref 30.0–36.0)
MCV: 89.9 fL (ref 80.0–100.0)
Platelets: 404 10*3/uL — ABNORMAL HIGH (ref 150–400)
RBC: 4.64 MIL/uL (ref 3.87–5.11)
RDW: 12.6 % (ref 11.5–15.5)
WBC: 6 10*3/uL (ref 4.0–10.5)
nRBC: 0 % (ref 0.0–0.2)

## 2023-09-25 MED ORDER — GABAPENTIN 300 MG PO CAPS
300.0000 mg | ORAL_CAPSULE | ORAL | Status: AC
  Administered 2023-09-26: 300 mg via ORAL

## 2023-09-25 MED ORDER — CEFAZOLIN SODIUM-DEXTROSE 2-4 GM/100ML-% IV SOLN
2.0000 g | INTRAVENOUS | Status: AC
  Administered 2023-09-26: 2 g via INTRAVENOUS

## 2023-09-25 MED ORDER — ORAL CARE MOUTH RINSE
15.0000 mL | Freq: Once | OROMUCOSAL | Status: AC
Start: 2023-09-25 — End: 2023-09-26

## 2023-09-25 MED ORDER — LACTATED RINGERS IV SOLN
INTRAVENOUS | Status: DC
Start: 2023-09-25 — End: 2023-09-26

## 2023-09-25 MED ORDER — ACETAMINOPHEN 500 MG PO TABS
1000.0000 mg | ORAL_TABLET | ORAL | Status: AC
  Administered 2023-09-26: 1000 mg via ORAL

## 2023-09-25 MED ORDER — CELECOXIB 200 MG PO CAPS
400.0000 mg | ORAL_CAPSULE | ORAL | Status: AC
  Administered 2023-09-26: 400 mg via ORAL

## 2023-09-25 MED ORDER — CHLORHEXIDINE GLUCONATE 0.12 % MT SOLN
15.0000 mL | Freq: Once | OROMUCOSAL | Status: AC
  Administered 2023-09-26: 15 mL via OROMUCOSAL

## 2023-09-25 MED ORDER — POVIDONE-IODINE 10 % EX SWAB: 2.0000 | Freq: Once | CUTANEOUS | Status: DC

## 2023-09-26 ENCOUNTER — Encounter: Payer: Self-pay | Admitting: Obstetrics and Gynecology

## 2023-09-26 ENCOUNTER — Other Ambulatory Visit: Payer: Self-pay

## 2023-09-26 ENCOUNTER — Ambulatory Visit: Payer: Self-pay | Admitting: Urgent Care

## 2023-09-26 ENCOUNTER — Ambulatory Visit
Admission: RE | Admit: 2023-09-26 | Discharge: 2023-09-26 | Disposition: A | Discharge status: Home / Self Care | Payer: BC Managed Care – PPO | Source: Ambulatory Visit | Attending: Obstetrics and Gynecology | Admitting: Obstetrics and Gynecology

## 2023-09-26 ENCOUNTER — Encounter
Admission: RE | Disposition: A | Discharge status: Home / Self Care | Payer: Self-pay | Source: Ambulatory Visit | Attending: Obstetrics and Gynecology

## 2023-09-26 DIAGNOSIS — N870 Mild cervical dysplasia: Secondary | ICD-10-CM | POA: Diagnosis not present

## 2023-09-26 DIAGNOSIS — N946 Dysmenorrhea, unspecified: Secondary | ICD-10-CM | POA: Diagnosis not present

## 2023-09-26 DIAGNOSIS — Z01812 Encounter for preprocedural laboratory examination: Secondary | ICD-10-CM

## 2023-09-26 DIAGNOSIS — N871 Moderate cervical dysplasia: Secondary | ICD-10-CM

## 2023-09-26 DIAGNOSIS — N736 Female pelvic peritoneal adhesions (postinfective): Secondary | ICD-10-CM | POA: Diagnosis not present

## 2023-09-26 DIAGNOSIS — N83202 Unspecified ovarian cyst, left side: Secondary | ICD-10-CM | POA: Insufficient documentation

## 2023-09-26 DIAGNOSIS — T50905A Adverse effect of unspecified drugs, medicaments and biological substances, initial encounter: Secondary | ICD-10-CM

## 2023-09-26 DIAGNOSIS — E538 Deficiency of other specified B group vitamins: Secondary | ICD-10-CM

## 2023-09-26 DIAGNOSIS — D251 Intramural leiomyoma of uterus: Secondary | ICD-10-CM | POA: Diagnosis not present

## 2023-09-26 DIAGNOSIS — R634 Abnormal weight loss: Secondary | ICD-10-CM

## 2023-09-26 DIAGNOSIS — D259 Leiomyoma of uterus, unspecified: Secondary | ICD-10-CM

## 2023-09-26 DIAGNOSIS — N83201 Unspecified ovarian cyst, right side: Secondary | ICD-10-CM | POA: Insufficient documentation

## 2023-09-26 DIAGNOSIS — R5383 Other fatigue: Secondary | ICD-10-CM

## 2023-09-26 HISTORY — PX: LAPAROSCOPIC VAGINAL HYSTERECTOMY WITH SALPINGECTOMY: SHX6680

## 2023-09-26 LAB — ABO/RH: ABO/RH(D): O POS

## 2023-09-26 LAB — POCT PREGNANCY, URINE: Preg Test, Ur: NEGATIVE

## 2023-09-26 LAB — CBC
HCT: 42.7 % (ref 36.0–46.0)
Hemoglobin: 14.6 g/dL (ref 12.0–15.0)
MCH: 31.9 pg (ref 26.0–34.0)
MCHC: 34.2 g/dL (ref 30.0–36.0)
MCV: 93.2 fL (ref 80.0–100.0)
Platelets: 383 10*3/uL (ref 150–400)
RBC: 4.58 MIL/uL (ref 3.87–5.11)
RDW: 12.6 % (ref 11.5–15.5)
WBC: 7.6 10*3/uL (ref 4.0–10.5)
nRBC: 0 % (ref 0.0–0.2)

## 2023-09-26 SURGERY — HYSTERECTOMY, VAGINAL, LAPAROSCOPY-ASSISTED, WITH SALPINGECTOMY
Anesthesia: General

## 2023-09-26 MED ORDER — ROCURONIUM BROMIDE 10 MG/ML (PF) SYRINGE
PREFILLED_SYRINGE | INTRAVENOUS | Status: AC
  Filled 2023-09-26: qty 10

## 2023-09-26 MED ORDER — GLYCOPYRROLATE 0.2 MG/ML IJ SOLN
INTRAMUSCULAR | Status: AC
  Filled 2023-09-26: qty 1

## 2023-09-26 MED ORDER — BUPIVACAINE HCL (PF) 0.5 % IJ SOLN
INTRAMUSCULAR | Status: AC
  Filled 2023-09-26: qty 30

## 2023-09-26 MED ORDER — MIDAZOLAM HCL 5 MG/5ML IJ SOLN
INTRAMUSCULAR | Status: DC | PRN
  Administered 2023-09-26: 2 mg via INTRAVENOUS

## 2023-09-26 MED ORDER — ONDANSETRON HCL 4 MG/2ML IJ SOLN
INTRAMUSCULAR | Status: AC
  Filled 2023-09-26: qty 2

## 2023-09-26 MED ORDER — ONDANSETRON HCL 4 MG/2ML IJ SOLN
INTRAMUSCULAR | Status: DC | PRN
  Administered 2023-09-26: 4 mg via INTRAVENOUS

## 2023-09-26 MED ORDER — SUGAMMADEX SODIUM 200 MG/2ML IV SOLN
INTRAVENOUS | Status: AC
  Filled 2023-09-26: qty 2

## 2023-09-26 MED ORDER — DEXAMETHASONE SODIUM PHOSPHATE 10 MG/ML IJ SOLN
INTRAMUSCULAR | Status: DC | PRN
  Administered 2023-09-26: 10 mg via INTRAVENOUS

## 2023-09-26 MED ORDER — OXYCODONE HCL 5 MG/5ML PO SOLN: 5.0000 mg | Freq: Once | ORAL | Status: AC | PRN

## 2023-09-26 MED ORDER — ROCURONIUM BROMIDE 100 MG/10ML IV SOLN
INTRAVENOUS | Status: DC | PRN
  Administered 2023-09-26: 50 mg via INTRAVENOUS
  Administered 2023-09-26: 30 mg via INTRAVENOUS
  Administered 2023-09-26: 10 mg via INTRAVENOUS

## 2023-09-26 MED ORDER — OXYCODONE HCL 5 MG PO TABS
5.0000 mg | ORAL_TABLET | Freq: Once | ORAL | Status: AC | PRN
  Administered 2023-09-26: 5 mg via ORAL

## 2023-09-26 MED ORDER — ACETAMINOPHEN 500 MG PO TABS
ORAL_TABLET | ORAL | Status: AC
  Filled 2023-09-26: qty 2

## 2023-09-26 MED ORDER — DEXAMETHASONE SODIUM PHOSPHATE 10 MG/ML IJ SOLN
INTRAMUSCULAR | Status: AC
  Filled 2023-09-26: qty 1

## 2023-09-26 MED ORDER — FENTANYL CITRATE (PF) 100 MCG/2ML IJ SOLN
INTRAMUSCULAR | Status: AC
Start: 2023-09-26 — End: ?
  Filled 2023-09-26: qty 2

## 2023-09-26 MED ORDER — LIDOCAINE HCL (CARDIAC) PF 100 MG/5ML IV SOSY
PREFILLED_SYRINGE | INTRAVENOUS | Status: DC | PRN
  Administered 2023-09-26: 80 mg via INTRAVENOUS

## 2023-09-26 MED ORDER — MIDAZOLAM HCL 2 MG/2ML IJ SOLN
INTRAMUSCULAR | Status: AC
  Filled 2023-09-26: qty 2

## 2023-09-26 MED ORDER — DEXMEDETOMIDINE HCL IN NACL 200 MCG/50ML IV SOLN
INTRAVENOUS | Status: DC | PRN
  Administered 2023-09-26 (×4): 8 ug via INTRAVENOUS

## 2023-09-26 MED ORDER — CHLORHEXIDINE GLUCONATE 0.12 % MT SOLN
OROMUCOSAL | Status: AC
  Filled 2023-09-26: qty 15

## 2023-09-26 MED ORDER — VASOPRESSIN 20 UNIT/ML IV SOLN
INTRAVENOUS | Status: DC | PRN
  Administered 2023-09-26: 15 mL via INTRAMUSCULAR

## 2023-09-26 MED ORDER — SODIUM CHLORIDE (PF) 0.9 % IJ SOLN
INTRAMUSCULAR | Status: AC
  Filled 2023-09-26: qty 50

## 2023-09-26 MED ORDER — PROPOFOL 1000 MG/100ML IV EMUL
INTRAVENOUS | Status: AC
  Filled 2023-09-26: qty 100

## 2023-09-26 MED ORDER — CELECOXIB 200 MG PO CAPS
ORAL_CAPSULE | ORAL | Status: AC
  Filled 2023-09-26: qty 2

## 2023-09-26 MED ORDER — BUPIVACAINE HCL 0.5 % IJ SOLN
INTRAMUSCULAR | Status: DC | PRN
  Administered 2023-09-26: 16 mL

## 2023-09-26 MED ORDER — GABAPENTIN 300 MG PO CAPS
ORAL_CAPSULE | ORAL | Status: AC
  Filled 2023-09-26: qty 1

## 2023-09-26 MED ORDER — OXYCODONE HCL 5 MG PO TABS
ORAL_TABLET | ORAL | Status: AC
  Filled 2023-09-26: qty 1

## 2023-09-26 MED ORDER — PROPOFOL 500 MG/50ML IV EMUL
INTRAVENOUS | Status: DC | PRN
  Administered 2023-09-26 (×2): 200 mg via INTRAVENOUS
  Administered 2023-09-26: 175 ug/kg/min via INTRAVENOUS
  Administered 2023-09-26: 150 ug/kg/min via INTRAVENOUS

## 2023-09-26 MED ORDER — GLYCOPYRROLATE 0.2 MG/ML IJ SOLN
INTRAMUSCULAR | Status: DC | PRN
  Administered 2023-09-26: .2 mg via INTRAVENOUS

## 2023-09-26 MED ORDER — HYDROCODONE-ACETAMINOPHEN 5-325 MG PO TABS: 1.0000 | ORAL_TABLET | Freq: Four times a day (QID) | ORAL | 0 refills | Status: DC | PRN

## 2023-09-26 MED ORDER — DEXMEDETOMIDINE HCL IN NACL 80 MCG/20ML IV SOLN
INTRAVENOUS | Status: AC
  Filled 2023-09-26: qty 20

## 2023-09-26 MED ORDER — CEFAZOLIN SODIUM-DEXTROSE 2-4 GM/100ML-% IV SOLN
INTRAVENOUS | Status: AC
  Filled 2023-09-26: qty 100

## 2023-09-26 MED ORDER — FENTANYL CITRATE (PF) 100 MCG/2ML IJ SOLN
25.0000 ug | INTRAMUSCULAR | Status: DC | PRN
  Administered 2023-09-26: 25 ug via INTRAVENOUS

## 2023-09-26 MED ORDER — SUGAMMADEX SODIUM 200 MG/2ML IV SOLN
INTRAVENOUS | Status: DC | PRN
  Administered 2023-09-26: 200 mg via INTRAVENOUS

## 2023-09-26 MED ORDER — FENTANYL CITRATE (PF) 100 MCG/2ML IJ SOLN
INTRAMUSCULAR | Status: DC | PRN
  Administered 2023-09-26 (×2): 50 ug via INTRAVENOUS

## 2023-09-26 MED ORDER — FENTANYL CITRATE (PF) 100 MCG/2ML IJ SOLN
INTRAMUSCULAR | Status: AC
  Filled 2023-09-26: qty 2

## 2023-09-26 MED ORDER — VASOPRESSIN 20 UNIT/ML IV SOLN
INTRAVENOUS | Status: AC
  Filled 2023-09-26: qty 1

## 2023-09-26 MED ORDER — LIDOCAINE HCL (PF) 2 % IJ SOLN
INTRAMUSCULAR | Status: AC
  Filled 2023-09-26: qty 5

## 2023-09-26 MED ORDER — 0.9 % SODIUM CHLORIDE (POUR BTL) OPTIME
TOPICAL | Status: DC | PRN
  Administered 2023-09-26: 500 mL

## 2023-09-26 SURGICAL SUPPLY — 49 items
ADHESIVE MASTISOL STRL (MISCELLANEOUS) IMPLANT
BAG URINE DRAIN 2000ML AR STRL (UROLOGICAL SUPPLIES) ×1 IMPLANT
BLADE SURG SZ11 CARB STEEL (BLADE) ×1 IMPLANT
CATH URTH 16FR FL 2W BLN LF (CATHETERS) ×1 IMPLANT
CHLORAPREP W/TINT 26 (MISCELLANEOUS) ×1 IMPLANT
DERMABOND ADVANCED .7 DNX12 (GAUZE/BANDAGES/DRESSINGS) ×1 IMPLANT
DRSG TEGADERM 4X10 (GAUZE/BANDAGES/DRESSINGS) IMPLANT
DRSG TEGADERM 4X4.75 (GAUZE/BANDAGES/DRESSINGS) IMPLANT
ELECT REM PT RETURN 9FT ADLT (ELECTROSURGICAL) ×1 IMPLANT
ELECTRODE REM PT RTRN 9FT ADLT (ELECTROSURGICAL) ×1 IMPLANT
GAUZE 4X4 16PLY ~~LOC~~+RFID DBL (SPONGE) ×1 IMPLANT
GLOVE PI ORTHO PRO STRL 7.5 (GLOVE) ×2 IMPLANT
GOWN STRL REUS W/ TWL LRG LVL3 (GOWN DISPOSABLE) ×1 IMPLANT
GOWN STRL REUS W/ TWL XL LVL3 (GOWN DISPOSABLE) ×2 IMPLANT
HANDLE YANKAUER SUCT BULB TIP (MISCELLANEOUS) IMPLANT
IRRIGATION STRYKERFLOW (MISCELLANEOUS) IMPLANT
IRRIGATOR STRYKERFLOW (MISCELLANEOUS) IMPLANT
IV LACTATED RINGERS 1000ML (IV SOLUTION) ×1 IMPLANT
KIT PINK PAD W/HEAD ARE REST (MISCELLANEOUS) ×1 IMPLANT
KIT PINK PAD W/HEAD ARM REST (MISCELLANEOUS) ×1 IMPLANT
KIT TURNOVER CYSTO (KITS) ×1 IMPLANT
LIGASURE LAP MARYLAND 5MM 37CM (ELECTROSURGICAL) IMPLANT
LIGASURE VESSEL 5MM BLUNT TIP (ELECTROSURGICAL) IMPLANT
MANIFOLD NEPTUNE II (INSTRUMENTS) ×1 IMPLANT
NDL SPNL 22GX3.5 QUINCKE BK (NEEDLE) ×1 IMPLANT
NEEDLE SPNL 22GX3.5 QUINCKE BK (NEEDLE) ×1 IMPLANT
NS IRRIG 500ML POUR BTL (IV SOLUTION) IMPLANT
PACK BASIN MINOR ARMC (MISCELLANEOUS) ×1 IMPLANT
PACK GYN LAPAROSCOPIC (MISCELLANEOUS) ×1 IMPLANT
PAD OB MATERNITY 11 LF (PERSONAL CARE ITEMS) ×1 IMPLANT
PAD PREP OB/GYN DISP 24X41 (PERSONAL CARE ITEMS) ×1 IMPLANT
PENCIL SMOKE EVACUATOR (MISCELLANEOUS) IMPLANT
RETRACTOR PHONTONGUIDE ADAPT (ADAPTER) IMPLANT
SCRUB CHG 4% DYNA-HEX 4OZ (MISCELLANEOUS) ×1 IMPLANT
SLEEVE Z-THREAD 5X100MM (TROCAR) ×2 IMPLANT
SOL ELECTROSURG ANTI STICK (MISCELLANEOUS) IMPLANT
SOLUTION ELECTROSURG ANTI STCK (MISCELLANEOUS) IMPLANT
SPONGE T-LAP 18X18 ~~LOC~~+RFID (SPONGE) IMPLANT
STRIP CLOSURE SKIN 1/2X4 (GAUZE/BANDAGES/DRESSINGS) IMPLANT
SUT VIC AB 0 CT1 27XCR 8 STRN (SUTURE) ×2 IMPLANT
SUT VIC AB 0 CT1 36 (SUTURE) ×1 IMPLANT
SUT VIC AB 4-0 PS2 27 (SUTURE) IMPLANT
SYR 10ML LL (SYRINGE) ×1 IMPLANT
SYR CONTROL 10ML LL (SYRINGE) ×1 IMPLANT
TAPE TRANSPORE STRL 2 31045 (GAUZE/BANDAGES/DRESSINGS) ×1 IMPLANT
TRAP FLUID SMOKE EVACUATOR (MISCELLANEOUS) ×1 IMPLANT
TROCAR Z-THREAD FIOS 5X100MM (TROCAR) ×1 IMPLANT
TUBING EVAC SMOKE HEATED PNEUM (TUBING) ×1 IMPLANT
WATER STERILE IRR 500ML POUR (IV SOLUTION) ×1 IMPLANT

## 2023-09-26 NOTE — Op Note (Signed)
 OPERATIVE NOTE 09/26/2023 11:49 AM  PRE-OPERATIVE DIAGNOSIS:  1) Disruptive dysmenorrhea CIN-2  POST-OPERATIVE DIAGNOSIS:  1) Same with pelvic adhesions and bilateral ovarian cysts  OPERATION: Procedure(s) (LRB): LAPAROSCOPIC ASSISTED VAGINAL HYSTERECTOMY WITH BILATERAL SALPINGECTOMY (N/A) Drainage of ovarian cysts  SURGEON(S): Surgeons and Role:    Linzie Collin, MD - Primary    Hildred Laser, MD - Assisting   ANESTHESIA: Choice  ESTIMATED BLOOD LOSS: 75 mL  OPERATIVE FINDINGS: Numerous pelvic adhesions to both adnexa and anterior uterus.  Small bilateral ovarian cysts - clear fluid.  SPECIMEN:  ID Type Source Tests Collected by Time Destination  1 : Uterus with cervix and bilateral tubes Tissue PATH Gyn benign resection SURGICAL PATHOLOGY Linzie Collin, MD 09/26/2023 1050     COMPLICATIONS: None  DRAINS: Foley to gravity  DISPOSITION: Stable to recovery room  DESCRIPTION OF PROCEDURE:      The patient was prepped and draped in the dorsolithotomy position and placed under general anesthesia. The bladder was emptied. The cervix was grasped with a multi-toothed tenaculum and a uterine manipulator was placed within the cervical os respecting the position and curvature of the uterus. After changing gloves we proceeded abdominally. A small infraumbilical incision was made and a 5 mm trocar port was placed within the abdominopelvic cavity. The opening pressure was less than 7 mmHg.  Approximately 3 and 1/2 L of carbon dioxide gas was instilled within the abdominal pelvic cavity. The laparoscope was placed and the pelvis and abdomen were carefully inspected. In the usual manner, under direct visualization right and left lower quadrant ports of 5 mm size were placed. Both ureters were identified in the pelvis prior to dissection or clamping and cutting of pedicles. The fallopian tubes were elevated and the mesenteric side systematically coagulated and divided  allowing the tube to be removed at the time of uterine removal. The round ligaments were coagulated and divided and a bladder flap was created. The upper aspect of the broad ligament was clamped coagulated and divided. The uterine arteries were skeletonized, triply coagulated and divided. Careful inspection of all pedicles and the remainder of the pelvis was performed. Hemostasis was noted.  Using the ligasure the small ovarian cyst were coagulated and a small incision/window was created and the cysts were drained of clear fluid.  Hemostasis was noted. The lower quadrant ports were removed, hemostasis of the port sites was noted, and the incisions were closed in subcuticular manner. The laparoscope and trocar sleeve were removed from the infraumbilical incision, hemostasis was noted, and the incision was closed in a subcuticular manner. A long-acting anesthetic was employed in the skin incisions. We then proceeded vaginally. A weighted speculum was placed posteriorly. A multi-toothed tenaculum was used to grasp the cervix and the cervix was injected in a circumferential manner with a dilute Pitressin solution. An incision was made around the cervix and the vaginal mucosa was dissected off of the cervix. The posterior cul-de-sac was identified and entered and the weighted speculum was placed within this. The anterior cul-de-sac was identified and entered and a retractor was placed and used to retract the bladder anteriorly keeping it out of the operative field. The uterosacral ligaments were clamped divided and suture ligated. The cardinal ligaments were clamped divided and suture ligated. The small remaining pedicle was clamped divided and suture ligated bilaterally allowing delivery of the specimen. Angle sutures were placed in the usual manner. A culdoplasty was performed. The peritoneum was identified anteriorly and  then incorporating the left upper pedicle left lower pedicle right lower pedicle right upper  pedicle and anterior peritoneum a pursestring suture was placed exteriorizing all pedicles. Hemostasis of all pedicles was noted at this time. The vaginal mucosa was then closed with a running suture of Vicryl. The patient went to recovery room in stable condition. Clear urine was noted in the Foley at the conclusion of the procedure. Dr. Valentino Saxon provided exposure, dissection, suctioning, retraction, and general support and assistance during the procedure.   Elonda Husky, M.D. 09/26/2023 11:49 AM

## 2023-09-26 NOTE — Anesthesia Procedure Notes (Signed)
 Procedure Name: Intubation Date/Time: 09/26/2023 10:12 AM  Performed by: Lysbeth Penner, CRNAPre-anesthesia Checklist: Patient identified, Emergency Drugs available, Suction available and Patient being monitored Patient Re-evaluated:Patient Re-evaluated prior to induction Oxygen Delivery Method: Circle system utilized Preoxygenation: Pre-oxygenation with 100% oxygen Induction Type: IV induction Ventilation: Mask ventilation without difficulty Laryngoscope Size: McGrath and 3 Grade View: Grade I Tube type: Oral Tube size: 6.5 mm Number of attempts: 1 Airway Equipment and Method: Stylet and Oral airway Placement Confirmation: ETT inserted through vocal cords under direct vision, positive ETCO2 and breath sounds checked- equal and bilateral Secured at: 21 cm Tube secured with: Tape Dental Injury: Teeth and Oropharynx as per pre-operative assessment

## 2023-09-26 NOTE — Anesthesia Postprocedure Evaluation (Signed)
 Anesthesia Post Note  Patient: Heather Mcgee  Procedure(s) Performed: LAPAROSCOPIC ASSISTED VAGINAL HYSTERECTOMY WITH BILATERAL SALPINGECTOMY  Patient location during evaluation: PACU Anesthesia Type: General Level of consciousness: awake and alert Pain management: pain level controlled Vital Signs Assessment: post-procedure vital signs reviewed and stable Respiratory status: spontaneous breathing, nonlabored ventilation, respiratory function stable and patient connected to nasal cannula oxygen Cardiovascular status: blood pressure returned to baseline and stable Postop Assessment: no apparent nausea or vomiting Anesthetic complications: no   There were no known notable events for this encounter.   Last Vitals:  Vitals:   09/26/23 1330 09/26/23 1340  BP: 103/67 97/63  Pulse: 78 75  Resp: 16 16  Temp: 36.5 C   SpO2: 98% 99%    Last Pain:  Vitals:   09/26/23 1340  TempSrc:   PainSc: 3                  Cleda Mccreedy Consepcion Utt

## 2023-09-26 NOTE — Anesthesia Preprocedure Evaluation (Signed)
 Anesthesia Evaluation  Patient identified by MRN, date of birth, ID band Patient awake    Reviewed: Allergy & Precautions, NPO status , Patient's Chart, lab work & pertinent test results  History of Anesthesia Complications (+) PONV and history of anesthetic complications  Airway Mallampati: III  TM Distance: <3 FB Neck ROM: full    Dental  (+) Chipped   Pulmonary neg pulmonary ROS, neg shortness of breath   Pulmonary exam normal        Cardiovascular Exercise Tolerance: Good (-) angina (-) Past MI negative cardio ROS Normal cardiovascular exam     Neuro/Psych  PSYCHIATRIC DISORDERS      negative neurological ROS     GI/Hepatic negative GI ROS, Neg liver ROS,neg GERD  ,,  Endo/Other  negative endocrine ROS    Renal/GU      Musculoskeletal   Abdominal   Peds  Hematology negative hematology ROS (+)   Anesthesia Other Findings Past Medical History: No date: Anxiety No date: Dizziness No date: Fatigue 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 No date: LGSIL on Pap smear of cervix  Past Surgical History: No date: CESAREAN SECTION No date: CHOLECYSTECTOMY No date: EYE SURGERY  BMI    Body Mass Index: 28.25 kg/m      Reproductive/Obstetrics negative OB ROS                             Anesthesia Physical Anesthesia Plan  ASA: 2  Anesthesia Plan: General ETT   Post-op Pain Management:    Induction: Intravenous  PONV Risk Score and Plan: Ondansetron, Dexamethasone, Midazolam and Treatment may vary due to age or medical condition  Airway Management Planned: Oral ETT  Additional Equipment:   Intra-op Plan:   Post-operative Plan: Extubation in OR  Informed Consent: I have reviewed the patients History and Physical, chart, labs and discussed the procedure including the risks, benefits and alternatives for the proposed anesthesia  with the patient or authorized representative who has indicated his/her understanding and acceptance.     Dental Advisory Given  Plan Discussed with: Anesthesiologist, CRNA and Surgeon  Anesthesia Plan Comments: (Patient consented for risks of anesthesia including but not limited to:  - adverse reactions to medications - damage to eyes, teeth, lips or other oral mucosa - nerve damage due to positioning  - sore throat or hoarseness - Damage to heart, brain, nerves, lungs, other parts of body or loss of life  Patient voiced understanding and assent.)       Anesthesia Quick Evaluation

## 2023-09-26 NOTE — Interval H&P Note (Signed)
 History and Physical Interval Note:  09/26/2023 9:34 AM  Heather Mcgee  has presented today for surgery, with the diagnosis of Disruptive dysmenorrhea CIN-2.  The various methods of treatment have been discussed with the patient and family. After consideration of risks, benefits and other options for treatment, the patient has consented to  Procedure(s): LAPAROSCOPIC ASSISTED VAGINAL HYSTERECTOMY WITH BILATERAL SALPINGECTOMY (N/A) as a surgical intervention.  The patient's history has been reviewed, patient examined, no change in status, stable for surgery.  I have reviewed the patient's chart and labs.  Questions were answered to the patient's satisfaction.     Brennan Bailey

## 2023-09-26 NOTE — Transfer of Care (Signed)
 Immediate Anesthesia Transfer of Care Note  Patient: Heather Mcgee  Procedure(s) Performed: LAPAROSCOPIC ASSISTED VAGINAL HYSTERECTOMY WITH BILATERAL SALPINGECTOMY  Patient Location: PACU  Anesthesia Type:General  Level of Consciousness: sedated  Airway & Oxygen Therapy: Patient Spontanous Breathing  Post-op Assessment: Report given to RN and Post -op Vital signs reviewed and stable  Post vital signs: Reviewed and stable  Last Vitals:  Vitals Value Taken Time  BP 118/81 09/26/23 1200  Temp    Pulse 87 09/26/23 1201  Resp 20 09/26/23 1201  SpO2 98 % 09/26/23 1201  Vitals shown include unfiled device data.  Last Pain:  Vitals:   09/26/23 0841  TempSrc:   PainSc: 0-No pain      Patients Stated Pain Goal: 0 (09/26/23 1610)  Complications: There were no known notable events for this encounter.

## 2023-09-27 ENCOUNTER — Encounter: Payer: Self-pay | Admitting: Obstetrics and Gynecology

## 2023-09-27 LAB — SURGICAL PATHOLOGY

## 2023-10-04 ENCOUNTER — Ambulatory Visit (INDEPENDENT_AMBULATORY_CARE_PROVIDER_SITE_OTHER): Payer: BC Managed Care – PPO | Admitting: Obstetrics and Gynecology

## 2023-10-04 ENCOUNTER — Encounter: Payer: Self-pay | Admitting: Obstetrics and Gynecology

## 2023-10-04 VITALS — BP 133/84 | HR 80 | Ht 66.0 in | Wt 180.3 lb

## 2023-10-04 DIAGNOSIS — Z4889 Encounter for other specified surgical aftercare: Secondary | ICD-10-CM

## 2023-10-04 DIAGNOSIS — Z9889 Other specified postprocedural states: Secondary | ICD-10-CM

## 2023-10-04 NOTE — Progress Notes (Signed)
 HPI:      [name] is a 43 y.o. G1P1001 who LMP was Patient's last menstrual period was 07/20/2023.  Subjective:   She presents today 1 week from hysterectomy.  She reports she is doing well she has occasional lower right sided pelvic pain but not bad.  She says her bladder function is much better than before and she seems very happy with her surgery. She does complain of some occasional stray emotions which suddenly make her sad.  She reports that she has not had issues with any type of depression in the past.    Hx: The following portions of the patient's history were reviewed and updated as appropriate:             She  has a past medical history of Anxiety, Dizziness, Fatigue, HPV (human papilloma virus) infection (2018), Insomnia secondary to anxiety, and LGSIL on Pap smear of cervix. She does not have any pertinent problems on file. She  has a past surgical history that includes Cesarean section; Cholecystectomy; Eye surgery; and Laparoscopic vaginal hysterectomy with salpingectomy (N/A, 09/26/2023). Her family history includes Hypertension in her father. She  reports that she has never smoked. She has never used smokeless tobacco. She reports current alcohol use. She reports that she does not use drugs. She has a current medication list which includes the following prescription(s): zzzquil, meclizine, and zepbound. She has no known allergies.       Review of Systems:  Review of Systems  Constitutional: Denied constitutional symptoms, night sweats, recent illness, fatigue, fever, insomnia and weight loss.  Eyes: Denied eye symptoms, eye pain, photophobia, vision change and visual disturbance.  Ears/Nose/Throat/Neck: Denied ear, nose, throat or neck symptoms, hearing loss, nasal discharge, sinus congestion and sore throat.  Cardiovascular: Denied cardiovascular symptoms, arrhythmia, chest pain/pressure, edema, exercise intolerance, orthopnea and palpitations.   Respiratory: Denied pulmonary symptoms, asthma, pleuritic pain, productive sputum, cough, dyspnea and wheezing.  Gastrointestinal: Denied, gastro-esophageal reflux, melena, nausea and vomiting.  Genitourinary: Denied genitourinary symptoms including symptomatic vaginal discharge, pelvic relaxation issues, and urinary complaints.  Musculoskeletal: Denied musculoskeletal symptoms, stiffness, swelling, muscle weakness and myalgia.  Dermatologic: Denied dermatology symptoms, rash and scar.  Neurologic: Denied neurology symptoms, dizziness, headache, neck pain and syncope.  Psychiatric: See HPI for additional information.  Endocrine: Denied endocrine symptoms including hot flashes and night sweats.   Meds:   Current Outpatient Medications on File Prior to Visit  Medication Sig Dispense Refill   diphenhydrAMINE HCl, Sleep, (ZZZQUIL) 50 MG/30ML LIQD Take 50 mg by mouth at bedtime as needed (sleep).     meclizine (ANTIVERT) 25 MG tablet Take 25 mg by mouth daily as needed for dizziness.     ZEPBOUND 7.5 MG/0.5ML Pen Inject 7.5 mg into the skin every Monday.     No current facility-administered medications on file prior to visit.      Objective:     Vitals:   10/04/23 1054  BP: 133/84  Pulse: 80   Filed Weights   10/04/23 1054  Weight: 180 lb 4.8 oz (81.8 kg)               Abdomen: Soft.  Non-tender.  No masses.  No HSM.  Incision/s: Intact.  Healing well.  No erythema.  No drainage.             Assessment:    G1P1001 There are no active problems to display for this patient.    1. Postoperative state  Excellent recovery.  Somewhat emotional at this time.   Plan:            1.  Wound care discussed.  Patient to slowly begin more normal activities with exception of heavy lifting and intercourse.  2.  We have discussed her emotional wellbeing and if this is not getting any better over the next week to 2 she is to contact us.  We will consider antidepressants at that  time. Orders No orders of the defined types were placed in this encounter.   No orders of the defined types were placed in this encounter.     F/U  Return in about 5 weeks (around 11/08/2023).  Elonda Husky, M.D. 10/04/2023 11:20 AM

## 2023-10-04 NOTE — Progress Notes (Signed)
 Patient presents for 1 week postop follow-up following LAVH w/ BS. She states still feeling some soreness but no major concerns.

## 2023-10-06 ENCOUNTER — Encounter: Payer: Self-pay | Admitting: Obstetrics and Gynecology

## 2023-10-07 DIAGNOSIS — Z0289 Encounter for other administrative examinations: Secondary | ICD-10-CM

## 2023-10-17 IMAGING — MG MM DIGITAL DIAGNOSTIC UNILAT*L* W/ TOMO W/ CAD
4 series · 4 of 12 positions shown · non-contrast
Comparison: Mammogram dated 11/16/2021.

CLINICAL DATA: Recall from screening mammogram for a mass in the
left breast.

EXAM:
DIGITAL DIAGNOSTIC UNILATERAL LEFT MAMMOGRAM WITH TOMOSYNTHESIS AND
CAD; ULTRASOUND LEFT BREAST LIMITED
TECHNIQUE: Left digital diagnostic mammography and breast tomosynthesis was
performed. The images were evaluated with computer-aided detection.;
Targeted ultrasound examination of the left breast was performed.

[L CC synth-2D]
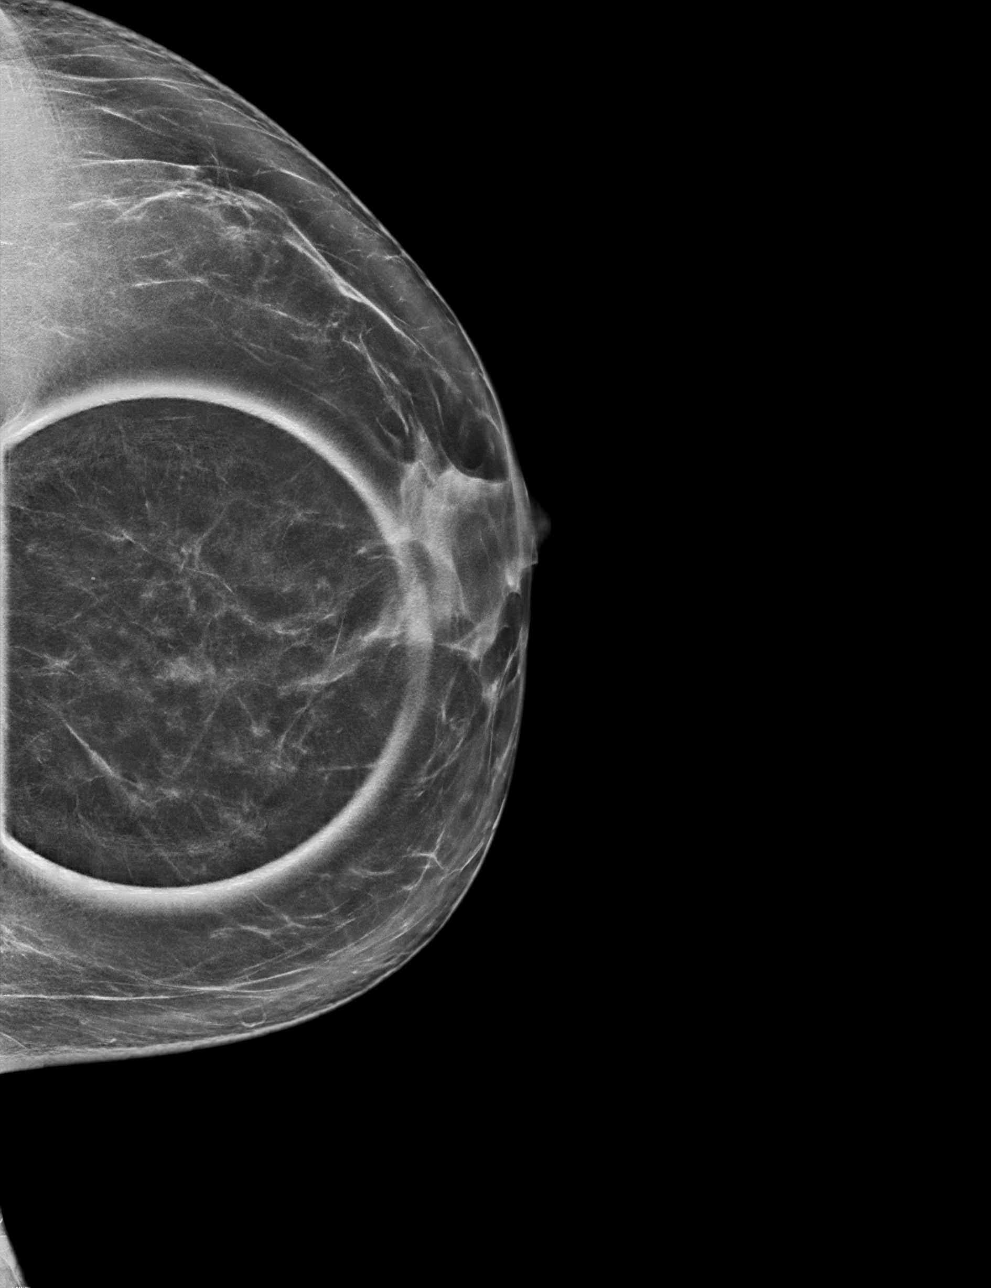

[L MLO synth-2D]
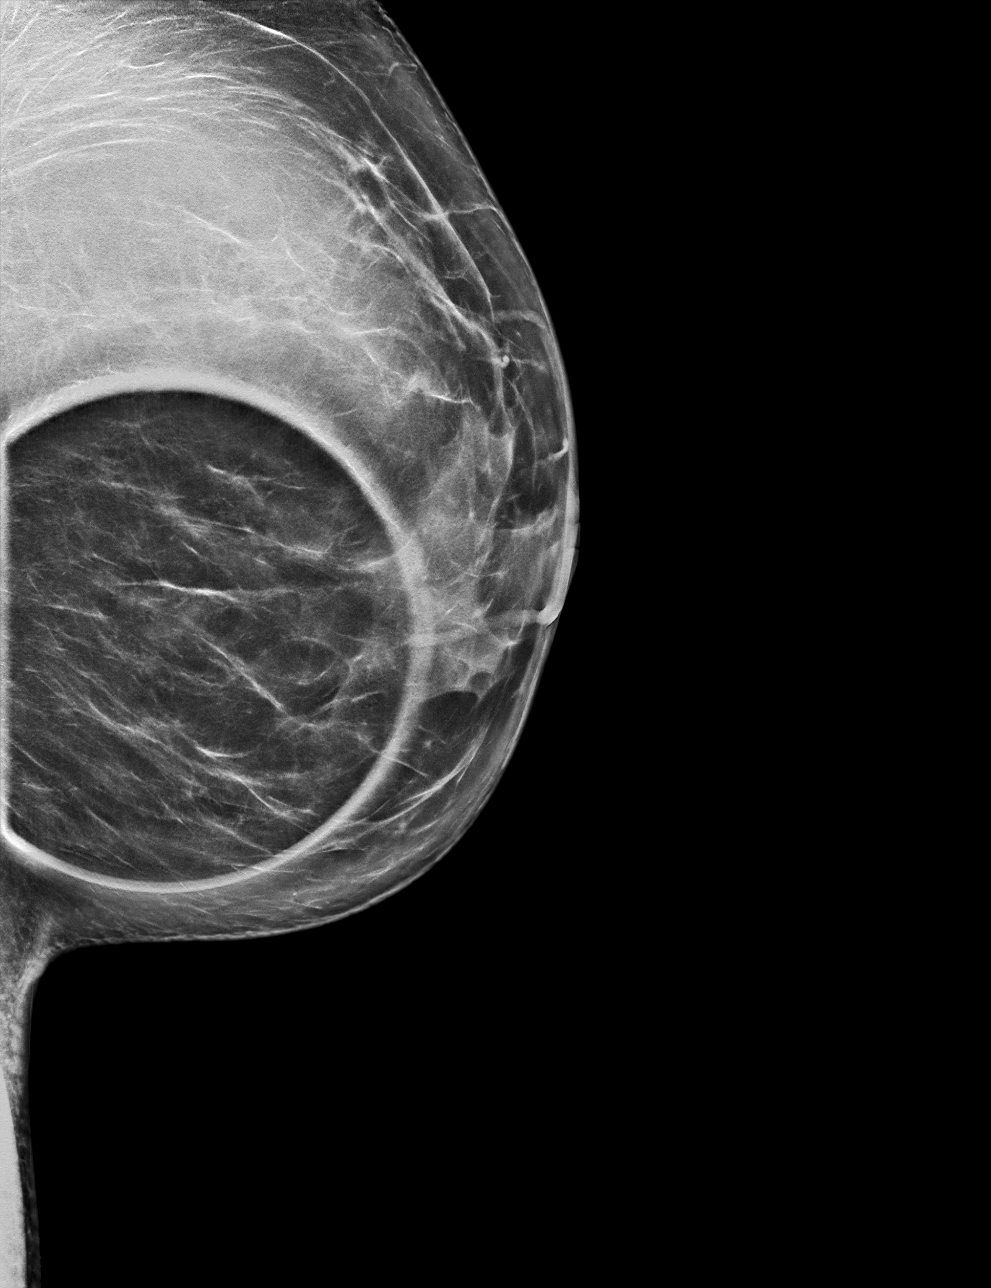

[L CC tomo · tomo slice 39/78.0]
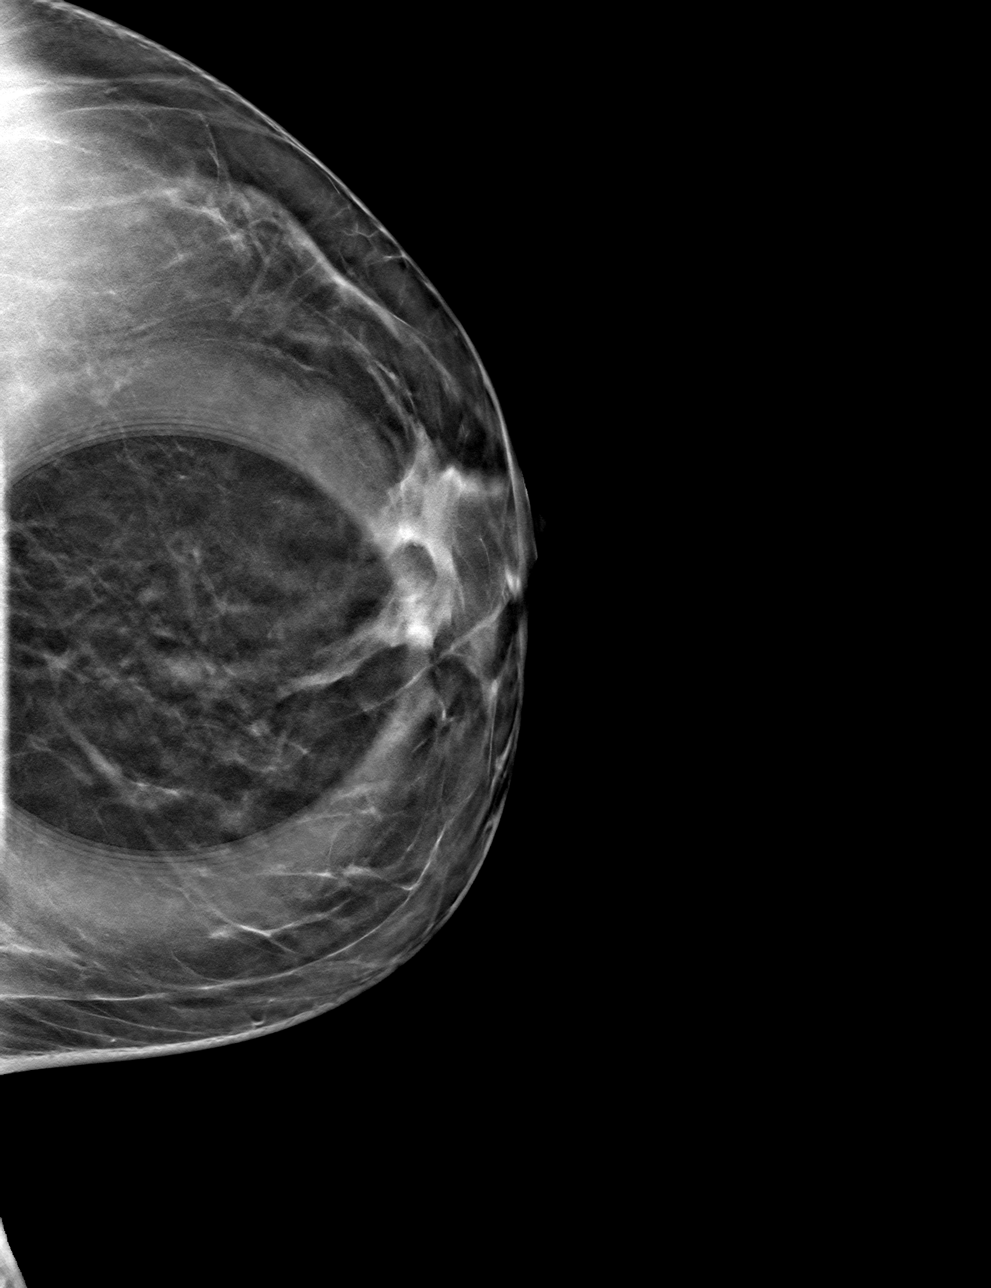

[L MLO tomo · tomo slice 41/80.0]
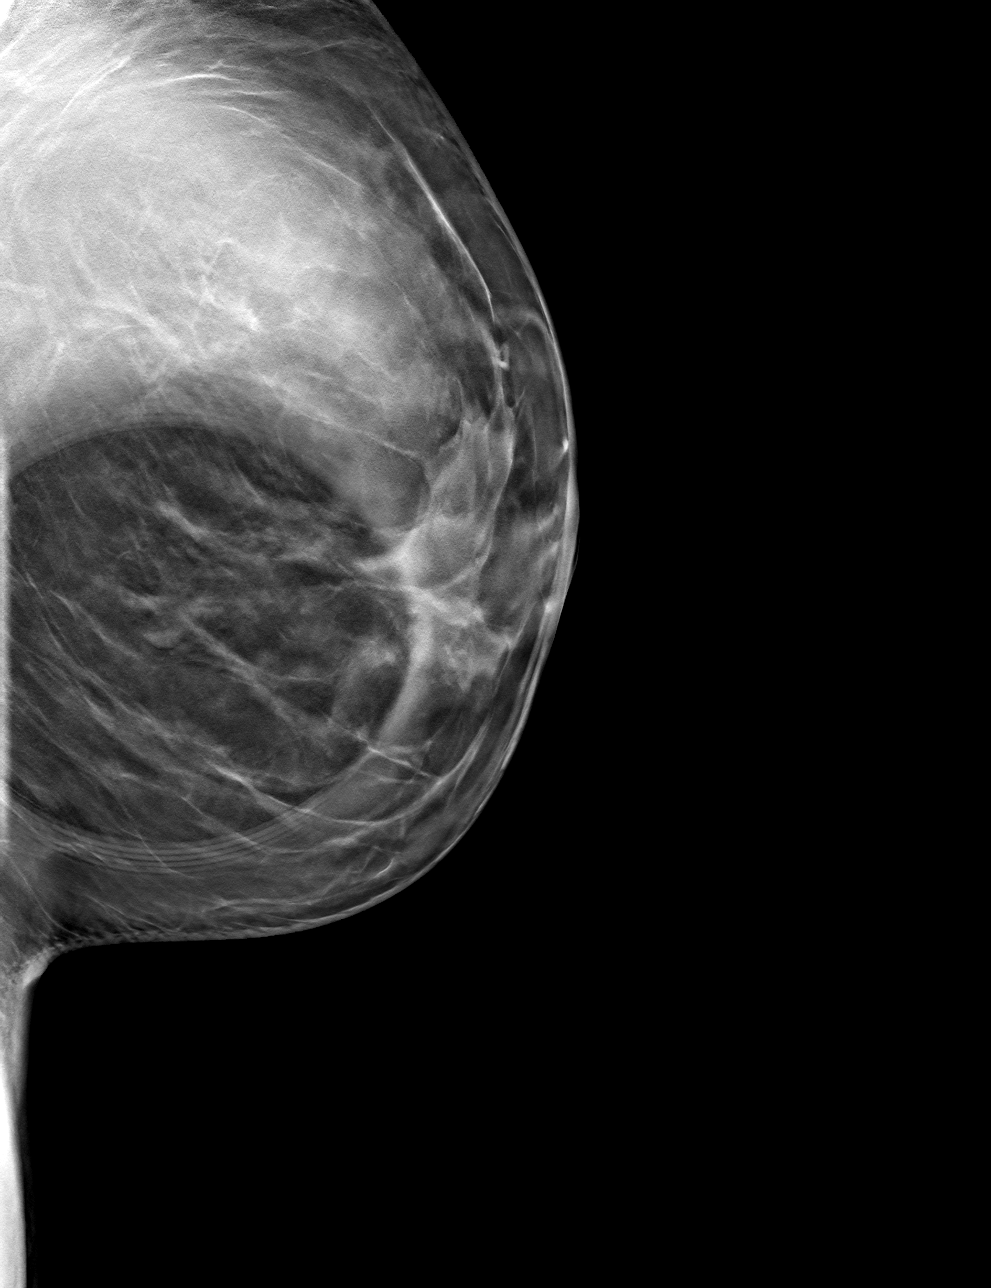

[4 of 12 positions shown; findings below may reference images not displayed]

ACR Breast Density Category b: There are scattered areas of
fibroglandular density.
FINDINGS: Additional views including spot compression demonstrate a 5 mm
circumscribed mass in the upper inner left breast.

Targeted left breast ultrasound was performed.

At 11 o'clock 1 cm from the nipple a benign cyst measures 4 x 6 x 5
mm. This corresponds to the 5 mm mass seen on the mammogram. No
suspicious solid mass is identified.
IMPRESSION: Benign cyst in the left breast.  No evidence of malignancy.

RECOMMENDATION:
Recommend routine annual screening mammogram in 1 year.

I have discussed the findings and recommendations with the patient.
If applicable, a reminder letter will be sent to the patient
regarding the next appointment.

BI-RADS CATEGORY  2: Benign.

## 2023-11-08 ENCOUNTER — Ambulatory Visit (INDEPENDENT_AMBULATORY_CARE_PROVIDER_SITE_OTHER): Admitting: Obstetrics and Gynecology

## 2023-11-08 ENCOUNTER — Encounter: Payer: Self-pay | Admitting: Obstetrics and Gynecology

## 2023-11-08 VITALS — BP 116/79 | HR 77 | Ht 66.0 in | Wt 173.5 lb

## 2023-11-08 DIAGNOSIS — Z9889 Other specified postprocedural states: Secondary | ICD-10-CM

## 2023-11-08 NOTE — Progress Notes (Signed)
 Patient presents for 6 week postop follow-up following LAVH. She states  doing great since surgery, no questions or concerns.

## 2023-11-08 NOTE — Progress Notes (Signed)
 HPI:      [name] is a 43 y.o. G1P1001 who LMP was Patient's last menstrual period was 07/20/2023.  Subjective:   She presents today 6 weeks from LAVH.  She reports that she feels like a new woman.  She is very happy with her surgery.  She is not having pain.  She is not having vaginal bleeding.  She has resumed work.  She has not yet had intercourse.    Hx: The following portions of the patient's history were reviewed and updated as appropriate:             She  has a past medical history of Anxiety, Dizziness, Fatigue, HPV (human papilloma virus) infection (2018), Insomnia secondary to anxiety, and LGSIL on Pap smear of cervix. She does not have any pertinent problems on file. She  has a past surgical history that includes Cesarean section; Cholecystectomy; Eye surgery; and Laparoscopic vaginal hysterectomy with salpingectomy (N/A, 09/26/2023). Her family history includes Hypertension in her father. She  reports that she has never smoked. She has never used smokeless tobacco. She reports current alcohol use. She reports that she does not use drugs. She has a current medication list which includes the following prescription(s): zzzquil, meclizine, and zepbound. She has no known allergies.       Review of Systems:  Review of Systems  Constitutional: Denied constitutional symptoms, night sweats, recent illness, fatigue, fever, insomnia and weight loss.  Eyes: Denied eye symptoms, eye pain, photophobia, vision change and visual disturbance.  Ears/Nose/Throat/Neck: Denied ear, nose, throat or neck symptoms, hearing loss, nasal discharge, sinus congestion and sore throat.  Cardiovascular: Denied cardiovascular symptoms, arrhythmia, chest pain/pressure, edema, exercise intolerance, orthopnea and palpitations.  Respiratory: Denied pulmonary symptoms, asthma, pleuritic pain, productive sputum, cough, dyspnea and wheezing.  Gastrointestinal: Denied, gastro-esophageal reflux,  melena, nausea and vomiting.  Genitourinary: Denied genitourinary symptoms including symptomatic vaginal discharge, pelvic relaxation issues, and urinary complaints.  Musculoskeletal: Denied musculoskeletal symptoms, stiffness, swelling, muscle weakness and myalgia.  Dermatologic: Denied dermatology symptoms, rash and scar.  Neurologic: Denied neurology symptoms, dizziness, headache, neck pain and syncope.  Psychiatric: Denied psychiatric symptoms, anxiety and depression.  Endocrine: Denied endocrine symptoms including hot flashes and night sweats.   Meds:   Current Outpatient Medications on File Prior to Visit  Medication Sig Dispense Refill   diphenhydrAMINE HCl, Sleep, (ZZZQUIL) 50 MG/30ML LIQD Take 50 mg by mouth at bedtime as needed (sleep).     meclizine (ANTIVERT) 25 MG tablet Take 25 mg by mouth daily as needed for dizziness.     ZEPBOUND 7.5 MG/0.5ML Pen Inject 7.5 mg into the skin every Monday.     No current facility-administered medications on file prior to visit.      Objective:     Vitals:   11/08/23 0829  BP: 116/79  Pulse: 77   Filed Weights   11/08/23 0829  Weight: 173 lb 8 oz (78.7 kg)               Abdomen: Soft.  Non-tender.  No masses.  No HSM.  Incision/s: Intact.  Healing well.  No erythema.  No drainage.   Physical examination   Pelvic:   Vulva: Normal appearance.  No lesions.  Vagina: No lesions or abnormalities noted.  Intact sutures noted at the vaginal cuff.  Support: Normal pelvic support.  Urethra No masses tenderness or scarring.  Meatus Normal size without lesions or prolapse.  Cervix: Surgically absent  Anus: Normal exam.  No  lesions.  Perineum: Normal exam.  No lesions.        Bimanual   Uterus: Surgically absent  Adnexae: No masses.  Non-tender to palpation.  Cul-de-sac: Negative for abnormality.             Assessment:    G1P1001 There are no active problems to display for this patient.    1. Postoperative state      Excellent recovery-patient very happy with her surgery.   Plan:            1.  May resume activities with exception of intercourse.  I have asked her to wait 2 more weeks prior to having intercourse to give the sutures further chance to heal. Orders No orders of the defined types were placed in this encounter.   No orders of the defined types were placed in this encounter.     F/U  Return in about 3 months (around 02/07/2024).  Delice Felt, M.D. 11/08/2023 9:07 AM

## 2024-02-07 ENCOUNTER — Ambulatory Visit: Admitting: Obstetrics and Gynecology

## 2024-02-07 ENCOUNTER — Encounter: Payer: Self-pay | Admitting: Obstetrics and Gynecology

## 2024-02-07 VITALS — BP 108/78 | HR 78 | Ht 66.0 in | Wt 164.0 lb

## 2024-02-07 DIAGNOSIS — Z4889 Encounter for other specified surgical aftercare: Secondary | ICD-10-CM

## 2024-02-07 DIAGNOSIS — Z9889 Other specified postprocedural states: Secondary | ICD-10-CM

## 2024-02-07 NOTE — Progress Notes (Signed)
 HPI:      [name] is a 43 y.o. G1P1001 who LMP was Patient's last menstrual period was 07/20/2023.  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  She is very excited about how she feels.  She is not having any pain she has had intercourse without issue.  She denies bleeding.  She says she feels like a new person.    Hx: The following portions of the patient's history were reviewed and updated as appropriate:             She  has a past medical history of Anxiety, Dizziness, Fatigue, HPV (human papilloma virus) infection (2018), Insomnia secondary to anxiety, and LGSIL on Pap smear of cervix. She does not have any pertinent problems on file. She  has a past surgical history that includes Cesarean section; Cholecystectomy; Eye surgery; and Laparoscopic vaginal hysterectomy with salpingectomy (N/A, 09/26/2023). Her family history includes Hypertension in her father. She  reports that she has never smoked. She has never used smokeless tobacco. She reports current alcohol use. She reports that she does not use drugs. She has a current medication list which includes the following prescription(s): zzzquil, meclizine, and zepbound. She has no known allergies.       Review of Systems:  Review of Systems  Constitutional: Denied constitutional symptoms, night sweats, recent illness, fatigue, fever, insomnia and weight loss.  Eyes: Denied eye symptoms, eye pain, photophobia, vision change and visual disturbance.  Ears/Nose/Throat/Neck: Denied ear, nose, throat or neck symptoms, hearing loss, nasal discharge, sinus congestion and sore throat.  Cardiovascular: Denied cardiovascular symptoms, arrhythmia, chest pain/pressure, edema, exercise intolerance, orthopnea and palpitations.  Respiratory: Denied pulmonary symptoms, asthma, pleuritic pain, productive sputum, cough, dyspnea and wheezing.  Gastrointestinal: Denied,  gastro-esophageal reflux, melena, nausea and vomiting.  Genitourinary: Denied genitourinary symptoms including symptomatic vaginal discharge, pelvic relaxation issues, and urinary complaints.  Musculoskeletal: Denied musculoskeletal symptoms, stiffness, swelling, muscle weakness and myalgia.  Dermatologic: Denied dermatology symptoms, rash and scar.  Neurologic: Denied neurology symptoms, dizziness, headache, neck pain and syncope.  Psychiatric: Denied psychiatric symptoms, anxiety and depression.  Endocrine: Denied endocrine symptoms including hot flashes and night sweats.   Meds:   Current Outpatient Medications on File Prior to Visit  Medication Sig Dispense Refill   diphenhydrAMINE HCl, Sleep, (ZZZQUIL) 50 MG/30ML LIQD Take 50 mg by mouth at bedtime as needed (sleep).     meclizine (ANTIVERT) 25 MG tablet Take 25 mg by mouth daily as needed for dizziness.     ZEPBOUND 7.5 MG/0.5ML Pen Inject 7.5 mg into the skin every Monday.     No current facility-administered medications on file prior to visit.      Objective:     Vitals:   02/07/24 0826  BP: 108/78  Pulse: 78   Filed Weights   02/07/24 0826  Weight: 164 lb (74.4 kg)                        Assessment:    G1P1001 There are no active problems to display for this patient.    1. Postoperative state     Excellent recovery.  Patient has resumed all normal activities.   Plan:            1.  Plan annual examination at approximately 1 year from her surgery date. Orders No orders of the defined types were placed in this encounter.   No  orders of the defined types were placed in this encounter.     F/U  Return in about 9 months (around 11/07/2024) for Annual Physical.  Alm DOROTHA Sar, M.D. 02/07/2024 8:48 AM

## 2024-02-07 NOTE — Progress Notes (Signed)
 Patient presents for 4 month postop follow-up following LAVH. She states doing great and has no complaints at this time.
